# Patient Record
Sex: Female | Born: 1970
Health system: Southern US, Community
[De-identification: ages and names within clinical notes are randomized; demographics above are authoritative.]

---

## 2005-03-30 ENCOUNTER — Emergency Department (HOSPITAL_COMMUNITY): Admission: EM | Admit: 2005-03-30 | Discharge: 2005-03-30 | Payer: Self-pay | Admitting: Emergency Medicine

## 2007-09-04 ENCOUNTER — Other Ambulatory Visit: Admission: RE | Admit: 2007-09-04 | Discharge: 2007-09-04 | Payer: Self-pay | Admitting: Family Medicine

## 2008-02-09 ENCOUNTER — Ambulatory Visit: Payer: Self-pay | Admitting: Sports Medicine

## 2008-02-09 DIAGNOSIS — M79609 Pain in unspecified limb: Secondary | ICD-10-CM

## 2008-02-09 DIAGNOSIS — M25579 Pain in unspecified ankle and joints of unspecified foot: Secondary | ICD-10-CM | POA: Insufficient documentation

## 2008-02-09 HISTORY — DX: Pain in unspecified ankle and joints of unspecified foot: M25.579

## 2008-02-09 HISTORY — DX: Pain in unspecified limb: M79.609

## 2008-02-18 ENCOUNTER — Encounter: Payer: Self-pay | Admitting: Sports Medicine

## 2008-06-24 ENCOUNTER — Ambulatory Visit: Payer: Self-pay | Admitting: Sports Medicine

## 2008-10-18 ENCOUNTER — Ambulatory Visit: Payer: Self-pay | Admitting: Sports Medicine

## 2011-01-23 ENCOUNTER — Ambulatory Visit (INDEPENDENT_AMBULATORY_CARE_PROVIDER_SITE_OTHER): Payer: BC Managed Care – PPO | Admitting: Sports Medicine

## 2011-01-23 ENCOUNTER — Encounter: Payer: Self-pay | Admitting: Sports Medicine

## 2011-01-23 VITALS — BP 96/61 | HR 54 | Ht 62.5 in | Wt 110.0 lb

## 2011-01-23 DIAGNOSIS — M79609 Pain in unspecified limb: Secondary | ICD-10-CM

## 2011-01-23 NOTE — Progress Notes (Signed)
  Subjective:    Patient ID: Natalie Schmidt, female    DOB: 04/27/1971, 40 y.o.   MRN: [id number]  HPI 40 yo female presents for new orthotics. She has been wearing orthotics for 12 years, but lost the pair that were made at this office a few years ago. She would like a new pair.  She is currently wearing an old pair that is many years old, but they are hard and not comfortable. She works out 1.5 hours per day 6 days per week.   Review of Systems     Objective:   Physical Exam NAD  Moderately Cavus foot Broader forefoot Narrow heel No swelling No TTP  Gait is very neutral in orthotics      Assessment & Plan:  Patient was fitted for a standard, cushioned, semi-rigid orthotic.  The orthotic was heated and the patient stood on the orthotic blank positioned on the orthotic stand. The patient was positioned in subtalar neutral position and 10 degrees of ankle dorsiflexion in a weight bearing stance. After molding, a stable Fast-Tech EVA base was applied to the orthotic blank.   The blank was ground to a stable position for weight bearing. Size: 9 base: Blue EVA posting: none additional orthotic padding: none

## 2011-01-23 NOTE — Assessment & Plan Note (Signed)
Cont on custom orthotics  These have been helpful for 12 years  Does not get foot pain w these  Reck prn and modify if needed

## 2014-03-23 ENCOUNTER — Ambulatory Visit: Payer: BC Managed Care – PPO | Admitting: Family Medicine

## 2014-04-05 ENCOUNTER — Ambulatory Visit (INDEPENDENT_AMBULATORY_CARE_PROVIDER_SITE_OTHER): Payer: BC Managed Care – PPO | Admitting: Family Medicine

## 2014-04-05 ENCOUNTER — Encounter: Payer: Self-pay | Admitting: Family Medicine

## 2014-04-05 VITALS — Ht 63.0 in | Wt 108.7 lb

## 2014-04-05 DIAGNOSIS — F5000 Anorexia nervosa, unspecified: Secondary | ICD-10-CM
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
## 2014-04-05 NOTE — Patient Instructions (Addendum)
-   Your body image is a central roadblock to making progress in your well being.    - It's not just about obsessing; you (like most people) cannot see yourself objectively.   - Indicators that your weight and body fat are low now:  - No period since last spring  - Low on BMI and weight charts  - CK levels are high, suggesting muscle wasting  - EKG abnormality  - Poor sleep  - Socially withdrawn  - Eat at least 3 meals and 1 snack per day.  Aim for no more than 5 hours between eating.  Eat breakfast within one hour of getting up.   - A real meal = includes a protein source, starch, and veg's and/or fruit.    - Or:  Would you serve this to a guest in your home, and call it a meal?  - Your meals should look like, taste like, and feel like real meals.   - Record daily intake, including what you eat, how much, and what time you eat.   - Vitamin D:  At least 1000 IU per day.  You get best absorption with a high-fat meal.   - Stop weighing yourself at home.  AT FOLLOW-UP, WE MAY DECIDE TO WEIGH YOU BLINDLY.    - Please look into finding a therapist if you do not get into the study at [place] to Dr. Truett Mainland about possibly increasing your fluoxetine.   - Blood work:  Also ask for vit D level and FERRITIN.   - When you restrict calories for your body, you also restrict food for your gut microbes.   - Check with [facility] to confirm that you have unlimited medical nutrition therapy visits covered.  Let Jeannie know.

## 2014-04-22 ENCOUNTER — Ambulatory Visit: Payer: BC Managed Care – PPO | Admitting: Family Medicine

## 2014-05-11 ENCOUNTER — Ambulatory Visit: Payer: BC Managed Care – PPO | Admitting: Family Medicine

## 2015-03-21 ENCOUNTER — Encounter: Payer: Self-pay | Admitting: Family Medicine

## 2015-03-21 ENCOUNTER — Ambulatory Visit (INDEPENDENT_AMBULATORY_CARE_PROVIDER_SITE_OTHER): Payer: 59 | Admitting: Family Medicine

## 2015-03-21 VITALS — Ht 63.0 in

## 2015-03-21 DIAGNOSIS — F5001 Anorexia nervosa, restricting type: Secondary | ICD-10-CM

## 2015-03-21 NOTE — Patient Instructions (Addendum)
GOAL:  Stop binge-eating and to not eat when not physiologically hungry.   Behavior goals: 1. Eat at least 3 meals and 1-2 snacks per day.  Aim for no more than 5 hours between eating.  Eat breakfast within one hour of getting up.  2. Food choice examples:  Breakfast:  Steel cut oats, 2 eggs  Snack:  Fruit, 3 tbsp nuts  Lunch:  Veg's (unlimited), meat, fish, or beans (protein) with a wrap, fruit  Dinner:   Ditto lunch except your starch can be a cooked grain   Snack:  Yogurt or kefir with berries     Portion control:  Twice the volume of veg's as either pro or starch, using a 10" plate.    Psychiatry referral:  Katheren Shams, MD
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
## 2015-04-05 ENCOUNTER — Ambulatory Visit (INDEPENDENT_AMBULATORY_CARE_PROVIDER_SITE_OTHER): Payer: 59 | Admitting: Family Medicine

## 2015-04-05 ENCOUNTER — Encounter: Payer: Self-pay | Admitting: Family Medicine

## 2015-04-05 VITALS — Ht 63.0 in

## 2015-04-05 DIAGNOSIS — F5001 Anorexia nervosa, restricting type: Secondary | ICD-10-CM | POA: Diagnosis not present

## 2015-04-05 DIAGNOSIS — E039 Hypothyroidism, unspecified: Secondary | ICD-10-CM | POA: Insufficient documentation

## 2015-04-05 DIAGNOSIS — F509 Eating disorder, unspecified: Secondary | ICD-10-CM | POA: Insufficient documentation

## 2015-04-05 NOTE — Patient Instructions (Addendum)
-   Check for therapists at [email or web address].  Some of these therapists do marriage counseling as well.   - Email your recipe for your tea mix and your mug cake.    Important Concepts - Satiety and pleasure from food is crucial to allowing your appetite mechanism to work right.  Your brain is wired to get enjoyment from food.   - Keep in mind that where we see disordered eating, it likely stems from an effort to regulate emotions using food.   - If you feel depressed and irritable now, how much of this is related to not feeding your brain adequately? - Appetite control depends in part on how you start your day, i.e., for best control, you want to eat a good source of protein and some carb (that includes fiber).  Breakfast should include at least 400 kcal.      Goals remain the same: 1. Eat at least 3 meals and 2 snacks per day. Aim for no more than 5 hours between eating. Eat breakfast within one hour of getting up.  2. Food choice examples:   Breakfast: Steel cut oats, 2 eggs   Snack: Fruit, 3 tbsp nuts   Lunch: Veg's (unlimited), meat, fish, or beans (protein) with a wrap, fruit   Dinner: Ditto lunch except your starch can be a cooked grain   Snack: Yogurt or kefir with berries  3. For the next 2 weeks, write down what you eat for the first meal of the day, how much, and what time.  Then also record the TIME at which you first notice hunger following that meal.   Portion control: Twice the volume of veg's as either pro or starch, using a 10" plate.   - Read Eating in the Light of the Moon by Romie Minus.  Use a highlighter as you read!

## 2015-04-05 NOTE — Progress Notes (Signed)
Medical Nutrition Therapy:  Appt start time: 1000 end time:  1100. PCP:   Dr. Maceo Pro [facility]) and Dr. Truett Mainland in [facility] (insur will not cover next yr) Therapist:  None currently; has seen Natalie Schmidt and one other in [place], but was not a good fit; needs to establish with a therapist.  Other medical team members: none; Natalie Schmidt will find out this week if she gets into a [facility] study about eating d/o's, so will hope to establish with a therapist thru the study.    Assessment:  Primary concerns today: anorexia nervosa, restricting type; F50.01. Weight:     123 lb (pt declined wt check today; self-report from 03/30/15; BMI 21.8) Height:     63" Expected body weight:    123 lb (= BMI of 21.8) Percent expected body weight:  100%  Usual eating pattern is erratic; no set schedule, although first meal of day is usually delayed until ~1 PM.   Usual physical activity includes 35-60 min running/day, sometimes weight training (1-2 X wk).    Natalie Schmidt said her anxiety has improved in the past year since she has gained weight, and although she still has anxiety, she can manage it.  Natalie Schmidt said she is tracking kcal intake, and estimates her intake yesterday was 2900.  Her own ideal would no more than 10% body fat, but said she could probably live with 13-15% fat.  Highest non-pregnant weight was 130 lb as a teenager, which is a time when she was binge-eating.  Natalie Schmidt said she is bingeing now, even though she is eating every 2 1/2-3 hrs.  She feels her hunger mechanism is "not working."  She said she feels hot a lot of the time now, and food doesn't taste good.  Again said she wishes she could just not eat, and she is especially bothered by her frequent feelings of hunger and inclination to eat sweets or other "non-foods."    Discussed at length Natalie Schmidt's resistance to eating satisfying foods, i.e., choosing instead mixtures of fiber sources with cottage cheese, etc. (see 24-hr recall below), and our human need for satisfying food  in order for our appetite mechanism to work correctly.  When asked when she has last eaten "normally," Natalie Schmidt said she ate pretty normal meals up until about 12 yrs ago, and she maintained a weight of ~108 at that time - without thinking about it.  She insists, however, that her body is constitutionally dense, that she cannot be thin by eating "normally."  24-hr recall:  (Up at 7 AM) B (8 AM)-  10 pistachios, 2 jaw brkrs, 1 cough drop, handful grapes, 5 choc squares Snk ( AM)-  --- L (11-1 PM)-  5 oz carrots, 2 small heads romaine, 8 oz [place], tea mix: lemon jc,  2 T raspber puree, 2 T cocoa, 2 T pro pdr, 1 T oat fiber, 2 T alm flour, 2 egg whts, 2-3 swtnr, 2 T cot chs, 1/2 t psyllium (270 kcal) Snk (2:30)-  4 oz [place], 3 oz choc cake (no frstng), 3 sml bananas, 1 1/2 T pb, gum (1000 kcal)  D (8:30 PM)-  Tea mix: pro & cocoa pdr, oat fiber, soaked oat groats, psyllium, 1 swtnr, 10 grapes, 9 g chs Snk (12 AM)-  1/4 lrg apple, 1/2 c raw chard, 1/4 oz cheese  Typical day? Yes.    RD Natalie Schmidt had advised Natalie Schmidt to eat at least 1800 kcal/day (w/ no exercise), with additional 250 kcal per 45 min  of exercise.  Esti estimates she is eating ~2400 kcal/day.     Progress Towards Goal(s):  In progress.   Nutritional Diagnosis:  NB-1.2 Harmful beliefs/attitudes about food or nutrition-related topics (use with caution) As related to eating disorder.  As evidenced by expressed extreme anxiety over weight despite a low BMI (19).    Intervention:  Nutrition counseling.   Learning Readiness:  Patient actually seems more ambivalent today (moving toward readiness?); has been maintaining a reasonable kcal intake, although still expresses a lot of anxiety about weight.   Handouts given during visit include:  AVS  Demonstrated degree of understanding via:  Teach Back   Monitoring/Evaluation:  Dietary intake, exercise, and body weight in 3 week(s).

## 2015-04-13 ENCOUNTER — Ambulatory Visit: Payer: 59 | Admitting: Family Medicine

## 2015-04-18 ENCOUNTER — Ambulatory Visit: Payer: 59 | Admitting: Family Medicine

## 2015-04-25 ENCOUNTER — Ambulatory Visit (INDEPENDENT_AMBULATORY_CARE_PROVIDER_SITE_OTHER): Payer: 59 | Admitting: Family Medicine

## 2015-04-25 ENCOUNTER — Encounter: Payer: Self-pay | Admitting: Family Medicine

## 2015-04-25 VITALS — Ht 63.0 in | Wt 124.0 lb

## 2015-04-25 DIAGNOSIS — F5001 Anorexia nervosa, restricting type: Secondary | ICD-10-CM

## 2015-04-25 NOTE — Progress Notes (Signed)
Medical Nutrition Therapy:  Appt start time: 1000 end time:  1100. PCP:   Dr. Maceo Pro [facility]) and Dr. Truett Mainland in [facility] (insur will not cover next yr) Therapist:  None currently; has seen Bennie Pierini and one other in [place], but was not a good fit; needs to establish with a therapist.  Other medical team members: none; [place] will find out this week if she gets into a [facility] study about eating d/o's, so will hope to establish with a therapist thru the study.    Assessment:  Primary concerns today: anorexia nervosa, restricting type; F50.01. Weight:     124 lb (self-reported from last week; BMI 21.9) Height:     63" Expected body weight:    123 lb (= BMI of 21.8) Percent expected body weight:  100%  Usual eating pattern is erratic; no set schedule, although first meal of day is usually delayed until ~1 PM.   Usual physical activity includes 35-60 min running/day, sometimes weight training (1-2 X wk).    Brae refused a weight check again today.  She did get the book Eating in the Light of the [place], which she has found to be very enlightening, but also very emotional.  She became tearful as she talked about some of the content helping her to realize much of the unfinished therapy work she has to do.  Ambra said her therapy at [facility] was helpful, but really just addressed "the surface."   She said she has been very depressed, including SI recently.  She again assured me that although she has considered plans, she will not act on them.  Dreya would not sign a contract that she won't hurt herself, but she did promise to be here for her next scheduled appt on January 10.  Significantly, she did agree to call for a therapy appt.    All 5 of Brinlynn's children have mental health problems, which is part of what fuels her depression.  Her 18-YO daughter is home now, and is dealing with her own severe depression as well.   Delorus has been trying to eat breakfast within the first hour of getting up, i.e., sauteed curried  veg's with olive oil or nut butter and chicken and quinoa or chick peas, aiming for ~500 kcal.  She said doing this would make her hungry ~90 min later, and again another 90 min later if she ate again.  We talked about the importance of eating foods that are truly appealing, not just nutritionally dense.    Ranay had been out of town for 10 days, got home Friday, and food choices have been chaotic since then.  Spent the whole wkend crying.  Abigayil could not remember yesterday's intake.  Today she had tea mix (below), couple bites of salad, and at 2 PM 6 tortilla chips & 3 oz [place], followed by gum and water.    Tyrese's standard "tea" drink: Tea (herb, green, or black) 2-3 T cocoa powder 1 T protein powder  T oat fiber  t psyllium 1-2 packs art swtnr (Truvia/Splenda) Sometimes adds vanilla and/or cinnamon and 1 T oat bran or 1 t soaked chia seeds.  RD Rondell Reams had advised Deneise Lever to eat at least 1800 kcal/day (w/ no exercise), with additional 250 kcal per 45 min of exercise.      Progress Towards Goal(s):  In progress.   Nutritional Diagnosis:  NB-1.2 Harmful beliefs/attitudes about food or nutrition-related topics (use with caution) As related to eating disorder.  As evidenced by expressed extreme anxiety over weight despite a low BMI (19).    Intervention:  Nutrition counseling.   Learning Readiness:  Patient actually seems more ambivalent today (moving toward readiness?); has been maintaining a reasonable kcal intake, although still expresses a lot of anxiety about weight.   Handouts given during visit include:  AVS  Demonstrated degree of understanding via:  Teach Back   Monitoring/Evaluation:  Dietary intake, exercise, and body weight in 3 week(s).

## 2015-04-25 NOTE — Patient Instructions (Addendum)
-   Send Natalie Schmidt the ingredients for your mug cake.    Goals for the next few weeks: 1. Eat mindfully, but making sure you are truly enjoying what you're eating.    - Give yourself a check each meal that meets this criterium. 2. Eat at least 3 REAL meals and 1-2 snacks per day.  Aim for no more than 5 hours between eating.  Eat breakfast within one hour of getting up.      Write about your response to this eating pattern.   3. Call for an appt with a therapist of your choice: [email or web address] 4. Continue to read Eating in the Light of the [facility].

## 2015-05-17 ENCOUNTER — Ambulatory Visit (INDEPENDENT_AMBULATORY_CARE_PROVIDER_SITE_OTHER): Payer: 59 | Admitting: Family Medicine

## 2015-05-17 ENCOUNTER — Encounter: Payer: Self-pay | Admitting: Family Medicine

## 2015-05-17 VITALS — Ht 63.0 in | Wt 128.0 lb

## 2015-05-17 DIAGNOSIS — F5001 Anorexia nervosa, restricting type: Secondary | ICD-10-CM | POA: Diagnosis not present

## 2015-05-17 NOTE — Progress Notes (Signed)
Medical Nutrition Therapy:  Appt start time: 1000 end time:  1100. PCP:   Dr. Maceo Pro [facility]) and Dr. Truett Mainland in [facility] (insur will not cover next yr) Therapist:  None currently; has seen Bennie Pierini and one other in [place], but was not a good fit; needs to establish with a therapist.  Other medical team members: none; Haizley will find out this week if she gets into a [facility] study about eating d/o's, so will hope to establish with a therapist thru the study.    Assessment:  Primary concerns today: anorexia nervosa, restricting type; F50.01. Weight:     128 lb (self-reported from today; BMI 21.9) Height:     63" Expected body weight:    123 lb (= BMI of 22.7) Percent expected body weight:  100%  Usual eating pattern is erratic; no set schedule, although first meal of day is usually delayed until ~1 PM.   Usual physical activity includes 35-60 min running/day, sometimes weight training (1-2 X wk).    Panzy refused a weight check again today, although she weighed herself at home before coming (using a medical scale).  She has stopped taking her fluoxetine; said she feels even more suicidal when taking it.  She said that although the past couple of weeks have been very bad in terms of depression, she feels the last 3-4 days have improved some.  On Sunday, she had an especially good day, the best in almost 3 years.    Amoya is very distressed that her weight has continued to go up.  She still is feeling hunger ~90 min after eating.  She usually responds by drinking only water, which buys her another 2 hours before she gives in to her hunger and eat again.  At other times, she feels just as hungry after eating as before.  Natalin tracked her meals to see how many times she truly enjoyed her food:  She enjoyed the food only 12 times during those two weeks.  We talked at length about the contributors to hunger, and identified a number of factors that may be at play for [facility].    Naliya has called four therapists,  and has not heard back from even one so far.  She called two psychiatrists, both of whom are out of network for her insurance.  She has continued reading eating in the Light of the [place], and is quite surprised by how relevant she is finding it.  Would like an opportunity to discuss some of it, although she did not remember to bring her notes on it today.    24-hr recall:  (Up at 7:30 AM) B (9:30 AM)-  20 almonds, GF crepes (6 egg whites, 2 T rice flr, ginger, 1/4 c quinoa, xantham gum), 6 oz pureed fruit (1 sugar:5 fruit);  Snk (10:30)-  2 small sq's of cake  L (12 PM)-  1 1/2 banana, 1 c 2% milk, 2 T alm meal (60 kcal), 3-4 bites cake Snk (4 PM)-  Few bites of choc's D (5 PM)-  1 c salad (collards, apple, 1 oz bl chs, 1/2 oz walnuts, 3 oz tofu, vinegar; 1/2 cocoa mix (50 kcal) Snk (9 PM)-  1 c 2% milk Typical day? Yes.  for a non-binge day.  Has been bingeing most days until last week, when bingeing has decreased.  RD Rondell Reams had advised Deneise Lever to eat at least 1800 kcal/day (w/ no exercise), with additional 250 kcal per 45 min of exercise.  Progress Towards Goal(s):  In progress.   Nutritional Diagnosis:  NB-1.2 Harmful beliefs/attitudes about food or nutrition-related topics (use with caution) As related to eating disorder.  As evidenced by expressed extreme anxiety over weight despite a low BMI (19).    Intervention:  Nutrition counseling.  Handouts given during visit include:  AVS  Demonstrated degree of understanding via:  Teach Back   Monitoring/Evaluation:  Dietary intake, exercise, and body weight in 2 week(s).

## 2015-05-17 NOTE — Patient Instructions (Addendum)
-   Psychiatrist Katheren Shams, MD in [facility] - Appetite Mechanism:  - Psychological component, i.e., over-focus on food and eating; the brain will drive you to eat for pleasure, so if you don't get pleasure from food, it will be hard to ever feel satiated; we     can obsess about foods when we feel we're getting the "wrong" ones or not enough "right" ones.  - Physical component, diet composition and timing of eating. Moderate exercise also usually helps the appetite mechanism to work better.    - Cultural adaptation, i.e., your body adapts to an imposed eating schedule, then wants to maintain that schedule.  Remember how long it's been since you had a "normal" eating    schedule and diet composition!  - If you are ravenous by the time you eat breakfast, you have waited too long.  It will be harder to reach satiety.  Breakfast within the first hour of being up! - There needs to be three definitive meals each day.  "Meals" should look like, feel like, and taste like a real meal, and not a snack.    - Would you serve this to a guest in your home, and call it a meal? - Moderate exercise:  You might want to start with 20 min of moderate strength building exercises at home using body weight only, i.e., squats, lunges, situps, pushups, other?  WRite out your routine, and limit yourself to 20 min max.    Goals remain the same: 1. Eat mindfully, but making sure you are truly enjoying what you're eating.  - Give yourself a check each meal that meets this criterium. 2. Eat at least 3 REAL meals and 1-2 snacks per day. Aim for no more than 5 hours between eating. Eat breakfast within one hour of getting up.   Write about your response to this eating pattern.  3. Call for an appt with a therapist of your choice: [email or web address] 4. Continue to read Eating in the Light of the [facility].  - Furniture conservator/restorer (1) which therapists you called and (2) sections of Eating in the  Light of the [place] that resonate with you especially.    - Follow- appts are Thur, 1/26 at 1:30 and Mon, 2/6 at 11 AM.

## 2015-06-02 ENCOUNTER — Ambulatory Visit (INDEPENDENT_AMBULATORY_CARE_PROVIDER_SITE_OTHER): Payer: 59 | Admitting: Family Medicine

## 2015-06-02 ENCOUNTER — Encounter: Payer: Self-pay | Admitting: Family Medicine

## 2015-06-02 VITALS — Ht 63.0 in

## 2015-06-02 DIAGNOSIS — F5001 Anorexia nervosa, restricting type: Secondary | ICD-10-CM | POA: Diagnosis not present

## 2015-06-02 NOTE — Progress Notes (Signed)
Medical Nutrition Therapy:  Appt start time: 1000 end time:  1100. PCP:   Dr. Maceo Pro [facility]) and Dr. Truett Mainland in [facility] (insur not covering now) Therapist:  None currently; has seen [employer] and one other in [place], but was not a good fit; needs to establish with a therapist.  Other medical team members: none; Natalie Schmidt will find out this week if she gets into a [facility] study about eating d/o's, so will hope to establish with a therapist thru the study.    Assessment:  Primary concerns today: anorexia nervosa, restricting type; F50.01. Weight:     Not measured Height:     63" Expected body weight:    123 lb (= BMI of 22.7) Percent expected body weight:  ---  Natalie Schmidt refused a weight check again today.  Depression is still severe.  Natalie Schmidt called psychiatrist Katheren Shams, but found she is out of network for her insurance.  She found the same results with a couple others she called.  Natalie Schmidt suspects she is bipolar, but has never been evaluated for this.  She did not call any therapists, having given up after trying to find a psychiatrist.    Had a long discussion today about residential treatment, which Natalie Schmidt agreed to at least investigate.  She said she trusted my judgment, and seemed receptive to the possibility that she can feel better at some point, and that life does not have to feel so hard as it does right now.  Also talked of some things Natalie Schmidt has been passionate about in the past, i.e., she loves to learn (and is very bright, I believe), and loves certain types of art/architecture.  Emphasized that these passions are still in her, but need to be "unearthed."  Natalie Schmidt agreed to schedule follow-up appts, which she said serve to help keep her on track.     Progress Towards Goal(s):  In progress.   Nutritional Diagnosis:  NB-1.2 Harmful beliefs/attitudes about food or nutrition-related topics (use with caution) As related to eating disorder.  As evidenced by expressed extreme anxiety over weight despite a low  BMI (19).    Intervention:  Nutrition counseling.  Handouts given during visit include:  AVS  Demonstrated degree of understanding via:  Teach Back   Monitoring/Evaluation:  Dietary intake, exercise, and body weight in 2 week(s).

## 2015-06-02 NOTE — Patient Instructions (Signed)
-   Write two lists:  - The potential benefits of residential treatment  - Potential downside of residential treatment  - Another two lists:  - The potential benefits of making no changes  - Potential downside of making no changes   - Email Jeannie your lists no later than tomorrow.    - I recommend that you at least explore residential treatment:  - [place]  - Call at least one to inquire about what it takes to participate in their program, i.e., paperwork, referrals, labwork, insurance?  - Email Jeannie by Friday to let me know what they say, and what the next step is.

## 2015-06-13 ENCOUNTER — Ambulatory Visit (INDEPENDENT_AMBULATORY_CARE_PROVIDER_SITE_OTHER): Payer: 59 | Admitting: Family Medicine

## 2015-06-13 ENCOUNTER — Encounter: Payer: Self-pay | Admitting: Family Medicine

## 2015-06-13 VITALS — Ht 63.0 in

## 2015-06-13 DIAGNOSIS — F5001 Anorexia nervosa, restricting type: Secondary | ICD-10-CM | POA: Diagnosis not present

## 2015-06-13 NOTE — Progress Notes (Signed)
Medical Nutrition Therapy:  Appt start time: 1000 end time:  1100. PCP:   Dr. Maceo Pro [facility]) and Dr. Truett Mainland in [facility] (insur not covering now) Therapist:  None currently; has seen [employer] and one other in [place], but was not a good fit; needs to establish with a therapist.  Other medical team members: none; Rainey will find out this week if she gets into a [facility] study about eating d/o's, so will hope to establish with a therapist thru the study.    Assessment:  Primary concerns today: anorexia nervosa, restricting type; F50.01. Weight:     Not measured Height:     63" Expected body weight:    123 lb (= BMI of 22.7) Percent expected body weight:  ---  Natalie Schmidt refused a weight check again today.  She saw a psychiatrist last week, who prescribed two new med's (clonazepam and trintellix), but she has not started them yet; is ambivalent about doing so.    Cardell has at least initiated the process of assessment for admission with [facility], and brought the hard copy of her forms for me to fax to them today.  She is still quite reluctant, believing there is no place likely to help her ever be able to accept living in her "fat" body (which is currently likely to be well within normal range of BMI).  We had a long discussion about residential treatment, and she is also willing to explore [place].     Progress Towards Goal(s):  In progress.   Nutritional Diagnosis:  NB-1.2 Harmful beliefs/attitudes about food or nutrition-related topics (use with caution) As related to eating disorder.  As evidenced by expressed extreme anxiety over weight despite a low BMI (19).    Intervention:  Nutrition counseling.  Handouts given during visit include:  AVS  Demonstrated degree of understanding via:  Teach Back   Monitoring/Evaluation:  Dietary intake, exercise, and body weight in 2 week(s).

## 2015-06-13 NOTE — Patient Instructions (Signed)
-   Look into [place], and do their online submission if you feel this is a viable option.    - Email me with an update as to your choices and action.  - I will fax your [place] Free form to them today.   - Embrace documentary tonight!  Food goal: Eat at least 4 X day, including 3 REAL meals.

## 2015-07-07 ENCOUNTER — Ambulatory Visit (INDEPENDENT_AMBULATORY_CARE_PROVIDER_SITE_OTHER): Payer: 59 | Admitting: Family Medicine

## 2015-07-07 ENCOUNTER — Encounter: Payer: Self-pay | Admitting: Family Medicine

## 2015-07-07 VITALS — Ht 63.0 in | Wt 127.0 lb

## 2015-07-07 DIAGNOSIS — F5001 Anorexia nervosa, restricting type: Secondary | ICD-10-CM | POA: Diagnosis not present

## 2015-07-07 NOTE — Progress Notes (Signed)
Medical Nutrition Therapy:  Appt start time: 1000 end time:  1100. PCP:   Dr. Maceo Pro [facility]) and Dr. Truett Mainland in [facility] (insur not covering now) Therapist:  None currently; has seen [employer] and one other in [place], but was not a good fit; needs to establish with a therapist.  Other medical team members: none; Maudella will find out this week if she gets into a [facility] study about eating d/o's, so will hope to establish with a therapist thru the study.    Assessment:  Primary concerns today: anorexia nervosa, restricting type; F50.01. Weight:     127 lb (self-rept) Height:     63" Expected body weight:    123 lb (= BMI of 22.7) Percent expected body weight:  ---  Deneise Lever refused a weight check again today, but reported a recent weight measured at home.  She recently returned from a 2-wk trip to [place], which she took by herself.  She started on the new Rx Trintellix about a week before she left, to which she attributes her suicidality going away.   Maryia has realized that she is able to not obsess over food now that she is weight-restored, but that now she is dealing with the obsession of living with fat on her body.  Although she has been physically active for most of her life, she does not want to exercise at all now b/c of how the fat feels on her.  I spent a good amount of time today explaining to Iler why we need to meet the body's energy needs, and that we need a certain amount of body fat to function optimally.  Also talked about how both physiology and our mental perspective can change over time, changing how we "feel" in our bodies and changing the level of acceptance/appreciation we have of them.     Progress Towards Goal(s):  In progress.   Nutritional Diagnosis:  NB-1.2 Harmful beliefs/attitudes about food or nutrition-related topics (use with caution) As related to eating disorder.  As evidenced by expressed extreme anxiety over weight despite a normal BMI (22.5).    Intervention:  Nutrition  counseling.  Handouts given during visit include:  AVS  [phone] handout  Demonstrated degree of understanding via:  Teach Back   Monitoring/Evaluation:  Dietary intake, exercise, and body weight in 2 week(s).

## 2015-07-07 NOTE — Patient Instructions (Addendum)
-   I like your concept of compromise with respect to exercise and your weight.   - Getting to the best weight for YOU requires meeting your energy needs with food, and exercising to the point where you can build and maintain an optimal muscle mass.  Keep in mind that in the past 18 months you have lost a LOT of muscle mass and strength.  This means you have also affected your metabolic rate.  As you are able to continue to meet your energy needs, your body now can start to rebuild muscle.  If you can be patient in this process (trust your body), you will likely feel (and be) stronger, and your body composition will change to meet its new demands, i.e., exercise.    - A starved body is usually very low in fat, but it's also guaranteed to be low in muscle.  A body with low muscle mass eventually becomes less and less independent.    - The KEY to getting to where you want to go is being patient enough to allow the body its time to adapt to both the demands of exercise and its food intake.   - Another part of being successful in this process is working on your perception of what is acceptable - both in terms of ultimate body size/shape/conposition and in how long the process takes.  THIS is a perfect time to use the [phone] process: Use the handout provided today, and watch the 30-minute Explanation: https://www.basementfamous.com  Email [place] if you have thoughts/Qs.    WHEN YOU COME IN NEXT TIME, WE WILL DEFINITELY DO A 24-HR RECALL OF WHAT YOU ATE.

## 2015-07-21 ENCOUNTER — Ambulatory Visit (INDEPENDENT_AMBULATORY_CARE_PROVIDER_SITE_OTHER): Payer: 59 | Admitting: Family Medicine

## 2015-07-21 ENCOUNTER — Encounter: Payer: Self-pay | Admitting: Family Medicine

## 2015-07-21 VITALS — Ht 65.0 in

## 2015-07-21 DIAGNOSIS — F5001 Anorexia nervosa, restricting type: Secondary | ICD-10-CM | POA: Diagnosis not present

## 2015-07-21 NOTE — Patient Instructions (Addendum)
-   Joint pain supplements: Glucosamine-chondroitin; turmeric; fish oil? - Gratitude:  Take a moment, and think about what about your body you are grateful for.    - Spending time:  Give some thought to what you would do if you never had to spend time thinking about or stressing over food and weight.   - How would you be different?  What would you do?  What would accomplish?  - After finishing a meal, if you still feel hungry, determine a number of minutes you will wait to see if this hunger passes.    - If still hungry, get a snack, and repeat the process.  In determining what to snack on, remember the Qs of a good food decision:  1. What am I in the mood for?  2. How hungry am I?  3. What's good for me?    Ask ALL 3 for the best decision.

## 2015-07-21 NOTE — Progress Notes (Signed)
Medical Nutrition Therapy:  Appt start time: 1000 end time:  1100. PCP:   Dr. Maceo Pro [facility]) and Dr. Truett Mainland in [facility] (insur not covering now) Therapist:  None currently; has seen [employer] and one other in [place], but was not a good fit; needs to establish with a therapist.  Other medical team members: none; Breanne will find out this week if she gets into a [facility] study about eating d/o's, so will hope to establish with a therapist thru the study.    Assessment:  Primary concerns today: anorexia nervosa, restricting type; F50.01. Weight:     --- Height:     63" Expected body weight:    123 lb (= BMI of 22.7) Percent expected body weight:  ---  Deneise Lever refused a weight check again today.  She has not been weighing herself, but is convinced that her weight is continuing to go up.  Annalysse said she is not restricting at all, and feels frustrated that she often feels out of control with her eating now.  She still struggles with exercising because of how much she feels her added body weight when she exercises.    We discussed at length the Qs Deneise Lever had emailed prior to today's appt:  Why exercise needs to be a part of her self-care in addn to making good food choices (she had said it is less effective than kcal restrxn to manage weight, so why bother?); and Qs about joint pain, anti-inflammatories, and how to better control her appetite.  She also asked about therapists; she has now seen Joanna Puff twice, and was rather noncommittal about decision to continue.  Kaybree again insisted that her body's natural inclination is to eat a lot and to be heavy.  When I asked her when she had ever consistently eaten in a way that actually met her energy needs, she admitted that she has never actually done so for any period of time, so admitted perhaps she doesn't know how she'll respond to this.    Significantly, Tacha has used the Urge process a few times, and has found it very helpful.  Even taught it to her daughter,  who suffers from depression.    24-hr recall:  (Up at 6:30 AM) B (8 AM)-  1 salad, 1 1/2 chx brst, mandarin oranges, 1-2 oz l-f chs, vinegar, 1/2 c bbq Snk ( AM)-  --- L (12 PM)-  Smoothie: 10 oz plain kefir, 1/2 c strber puree, cocoa mix w/ protein powder (~120 kcal), 1/4 c strber puree/jam & french cheese (120 kcal)  Snk (4 PM)-  [3 oz whole milk, pro powder (55 kcal)] X 2; 6 almonds D (6:30 PM)-  1 salad 4 oz chx, 2 T soaked lentils, 1/4 c strber puree/jam & french cheese (120 kcal), cocoa mix w/ protein powder (~120 kcal), 1/2 c beef&pork in sauce Snk (10 PM)-  6 almonds, 3 oz whole milk (65 kcal), pro powder (55 kcal)  Typical day? Yes.     Progress Towards Goal(s):  In progress.   Nutritional Diagnosis:  NB-1.2 Harmful beliefs/attitudes about food or nutrition-related topics (use with caution) As related to eating disorder.  As evidenced by expressed extreme anxiety over weight despite a normal BMI (22.5).    Intervention:  Nutrition counseling.  Handouts given during visit include:  AVS  Demonstrated degree of understanding via:  Teach Back   Monitoring/Evaluation:  Dietary intake, exercise, and body weight in 2 week(s).

## 2015-08-16 ENCOUNTER — Ambulatory Visit (INDEPENDENT_AMBULATORY_CARE_PROVIDER_SITE_OTHER): Payer: 59 | Admitting: Family Medicine

## 2015-08-16 ENCOUNTER — Encounter: Payer: Self-pay | Admitting: Family Medicine

## 2015-08-16 VITALS — Ht 63.0 in | Wt 133.0 lb

## 2015-08-16 DIAGNOSIS — F5001 Anorexia nervosa, restricting type: Secondary | ICD-10-CM | POA: Diagnosis not present

## 2015-08-16 NOTE — Patient Instructions (Addendum)
-   Check out the website:  NonSoap.gl  - Add at least one serving (1/2 cup) of a carb food to each meal.  - Give some thought to how else you can "normalize" your eating.    - Use the Urge 911 process, and write your answers when possible.    - Continue to eat at least 3 meals and 1-2 snacks per day.

## 2015-08-16 NOTE — Progress Notes (Signed)
Medical Nutrition Therapy:  Appt start time: 1000 end time:  1100. PCP:   Dr. Maceo Pro [facility]) and Dr. Truett Mainland in [facility] (insur not covering now) Therapist:  Joanna Schmidt   Assessment:  Primary concerns today: anorexia nervosa, restricting type; F50.01. Weight:     133 Height:     63" Expected body weight:    123 lb (= BMI of 22.7) Percent expected body weight:  ---  Natalie Schmidt refused a weight check again today, but reported a weight from last check at home.  She is still uncomfortable with her current weight, and said it has continued to go up.  She has been pretty successful in eating normal amounts of food and at normal times.  She is doing weight training 4-5 X wk and some cardio exercise no more than once a week.    Natalie Schmidt did start to use the Urge911 process, but lost her sheet, so did not remember all steps.    Natalie Schmidt has continued to see therapist Natalie Schmidt, and said that is going well; plans to see her every other week.    Today's recall (today and last night):  (Up at 7 AM) B (8:30 AM)-  1/4 apple, muffin (120 kcal), sports drink (lemon juice, spts drink powder) (20 kcal)  Wt training Snk (11:30)-  1 c of: pro powder, colostrum, pumpkin puree, apple sauce (200+ kcal) L (1 PM)-  6 c salad greens, 6 oz chx, 1 oz chs, kambucha, vinegar, 1-2 tsp psyllium powder, tea, 1 cocoa ball (100 kcal) Snk ( PM)-   Last night: D (6:30 PM)-  ??? Snk (10 PM)-  ??? Typical day? Yes.  except on 1-2 days a week Natalie Schmidt is ravenous first thing in the day.     Progress Towards Goal(s):  In progress.   Nutritional Diagnosis:  Slilght progress on NB-1.2 Harmful beliefs/attitudes about food or nutrition-related topics (use with caution) As related to eating disorder.  As evidenced by expressed extreme anxiety over weight despite a normal BMI (22.5).    Intervention:  Nutrition counseling.  Handouts given during visit include:  AVS  Demonstrated degree of understanding via:  Teach Back    Monitoring/Evaluation:  Dietary intake, exercise, and body weight in 2 week(s).

## 2015-09-01 ENCOUNTER — Encounter: Payer: Self-pay | Admitting: Family Medicine

## 2015-09-01 ENCOUNTER — Ambulatory Visit (INDEPENDENT_AMBULATORY_CARE_PROVIDER_SITE_OTHER): Payer: 59 | Admitting: Family Medicine

## 2015-09-01 VITALS — Ht 63.0 in | Wt 133.0 lb

## 2015-09-01 DIAGNOSIS — F5001 Anorexia nervosa, restricting type: Secondary | ICD-10-CM | POA: Diagnosis not present

## 2015-09-01 NOTE — Progress Notes (Signed)
Medical Nutrition Therapy:  Appt start time: 1000 end time:  1100. PCP:   Dr. Maceo Pro [facility]) and Dr. Truett Mainland in [facility] (insur not covering now) Therapist:  Joanna Puff  Assessment:  Primary concerns today: anorexia nervosa, restricting type; F50.01. Weight:     133 (self-report) Height:     63" Expected body weight:    123 lb (= BMI of 22.7) Percent expected body weight:  ---  Deneise Lever refused a weight check again today, but said it is unchanged, according to her home scale.  Tatyanna has used the [phone] process, but said today she really has not done the 4th step of intentional focus shifting.  She finds this challenging b/c she does not know what she finds rewarding.  Before her eating d/o, Yordanos enjoyed being in nature, hiking, biking, tennis, and other outdoor activities, cello, and gardening.    She is also frustrated as she has tried to normalize her eating.  Ilan still does not enjoy eating, and she doesn't like feeling satiated.  Anjolie also feels shame when she is seen eating, citing an example today of eating her lunch as she waited to pick her daughter up from school.  A man approached her to talk, and she felt tremendous shame for eating, feeling she did not deserve to eat.  We processed this experience using the Urge911 steps, and Nasim realized she has not been doing it exactly as intended.  She seemed to understand better both how it should be practiced and how it can help.  She became tearful as she talked of not wanting to feel these feelings, and that she believes intellectually that she does deserve to eat.    Ellieana is still seeing therapist Joanna Puff every other week, which has been helpful.    No recall today.     Progress Towards Goal(s):  In progress.   Nutritional Diagnosis:  Slight progress on NB-1.2 Harmful beliefs/attitudes about food or nutrition-related topics (use with caution) As related to eating disorder.  As evidenced by patient's admission that she does  actually deserve to eat (independent of what her weight is).    Intervention:  Nutrition counseling.  Handouts given during visit include:  AVS  Demonstrated degree of understanding via:  Teach Back   Monitoring/Evaluation:  Dietary intake, exercise, and body weight in 2 week(s).

## 2015-09-01 NOTE — Patient Instructions (Addendum)
-   Continue to use the Urge process, using the handout exactly as written.    - Spend some time thinking about what feeds your soul (step 4) or what you remember did at one time.  Write those down.  Bring to follow-up.   - Suggestion:  When no one is home, pull out your cello.     - Continue to get 3 real meals a day and snacks as appropriate (never go more than 5 hrs without eating).

## 2015-09-26 ENCOUNTER — Ambulatory Visit: Payer: 59 | Admitting: Family Medicine

## 2015-09-29 ENCOUNTER — Ambulatory Visit: Payer: 59 | Admitting: Family Medicine

## 2015-10-17 ENCOUNTER — Ambulatory Visit (INDEPENDENT_AMBULATORY_CARE_PROVIDER_SITE_OTHER): Payer: 59 | Admitting: Family Medicine

## 2015-10-17 ENCOUNTER — Encounter: Payer: Self-pay | Admitting: Family Medicine

## 2015-10-17 VITALS — Ht 63.0 in | Wt 130.0 lb

## 2015-10-17 DIAGNOSIS — F5001 Anorexia nervosa, restricting type: Secondary | ICD-10-CM | POA: Diagnosis not present

## 2015-10-17 NOTE — Progress Notes (Signed)
Medical Nutrition Therapy:  Appt start time: 1000 end time:  1100. PCP:   Dr. Maceo Pro [facility] FM) and Dr. Truett Mainland in [facility] (insur not covering now) Therapist:  Joanna Puff  Assessment:  Primary concerns today: anorexia nervosa, restricting type; F50.01. Weight:     130 (self-report) Height:     63" Expected body weight:    123 lb (= BMI of 22.7)  Natalie Schmidt refused a weight check again today.  Self-report is 130 lb.  Natalie Schmidt struggled with the recommendation to make a list of activities that give her pleasure/fulfillment.  She said she doesn't think she can enjoy anything at a body weight of 130 lb. She said she has recently realized how long she has been obsessed with her weight (~age 45).  We discussed her frequent self-description as fat, and how this language, especially repeated frequently, reinforces her belief that she is fat, although no one else sees her this way.     Natalie Schmidt is eating 3 meals a day and snacks.  Significantly, She said she is now experiencing satiety after eating, which she interprets as her body adapting to "normal eating."  She said she hates the feeling of satiation, but seemed to have forgotten that she liked even less the feeling of hunger 1-2 hrs post-eating that she complained of a couple months ago.  Natalie Schmidt seemed to appreciate this apparent progress when realized today.    Again, no 24-hr recall today.   Progress Towards Goal(s):  In progress.   Nutritional Diagnosis:  Stable progress on NB-1.2 Harmful beliefs/attitudes about food or nutrition-related topics (use with caution) As related to eating disorder.  As evidenced by usual intake of 3 meals and a couple of snacks daily.    Intervention:  Nutrition counseling.  Handouts given during visit include:  AVS  Demonstrated degree of understanding via:  Teach Back   Monitoring/Evaluation:  Dietary intake, exercise, and body weight in 1 week(s).

## 2015-10-17 NOTE — Patient Instructions (Signed)
-   Language matters:  Commit to stop using fat language about yourself.    - Be objective.    - Catch yourself in other negative talk.    - Write down at the end of the day some thoughts about this process.  - Continue to add to your list of Step 4 activities.    - Look for [employer] Series I and II.   - Use the Urge911 process at least 4 times a week (daily is better).

## 2015-10-24 ENCOUNTER — Ambulatory Visit: Payer: 59 | Admitting: Family Medicine

## 2015-11-07 ENCOUNTER — Ambulatory Visit (INDEPENDENT_AMBULATORY_CARE_PROVIDER_SITE_OTHER): Payer: 59 | Admitting: Family Medicine

## 2015-11-07 ENCOUNTER — Encounter: Payer: Self-pay | Admitting: Family Medicine

## 2015-11-07 VITALS — Ht 63.0 in

## 2015-11-07 DIAGNOSIS — F5001 Anorexia nervosa, restricting type: Secondary | ICD-10-CM

## 2015-11-07 NOTE — Patient Instructions (Addendum)
-   Use the [phone] process repeatedly each day.  Commit to using the process exactly as it is written on your handout, and write your answers occasionally as you are able.    - Bring your writing to follow-up.    - Eat at least 3 real meals a day:  Include some protein, starch, and veg's at both lunch and dinner.    - Also be sure you get an adequate breakfast:  Include protein, starch, veg's/fruit and some high-quality fat.    - Share the Urge process with [facility].

## 2015-11-07 NOTE — Progress Notes (Signed)
Medical Nutrition Therapy:  Appt start time: 1100 end time:  1200. PCP:   Dr. Maceo Pro [facility]) and Dr. Truett Mainland in [facility] (insur not covering now) Therapist:  Joanna Puff  Assessment:  Primary concerns today: anorexia nervosa, restricting type; F50.01. Weight:     130 (self-report) Height:     63" Expected body weight:    123 lb (= BMI of 22.7)  Atalaya said she has thought about her use of "fat language," but has found this challenging, and "it doesn't change how I feel about myself."  Amiliah feels she has been regressing, making poorer food choices, and she constantly hears that ED voice that is so critical.  We did not do a 24-hr recall today b/c time was spent talking about Azriel's inability to see herself as anything but fat, how to use the [phone] process, and other ways she can make progress.    Maddelyn had been using the [phone] process at least a few times a week, but upon reviewing it today, it became clear that she has not been following it verbatim, and it seems that the process has usually been interrupted with negative thoughts that she is trying to manage.  After reviewing the process, she seemed to have a good understanding of how to proceed.    Progress Towards Goal(s):  In progress.   Nutritional Diagnosis:  Stable progress on NB-1.2 Harmful beliefs/attitudes about food or nutrition-related topics (use with caution) As related to eating disorder.  As evidenced by usual intake of 3 meals and a couple of snacks daily.    Intervention:  Nutrition counseling.  Handouts given during visit include:  AVS  Demonstrated degree of understanding via:  Teach Back   Monitoring/Evaluation:  Dietary intake, exercise, and body weight in 3 week(s).

## 2015-11-28 ENCOUNTER — Ambulatory Visit (INDEPENDENT_AMBULATORY_CARE_PROVIDER_SITE_OTHER): Payer: 59 | Admitting: Family Medicine

## 2015-11-28 DIAGNOSIS — F5001 Anorexia nervosa, restricting type: Secondary | ICD-10-CM

## 2015-11-28 NOTE — Patient Instructions (Addendum)
Food sources of thiamin: sunflower seeds, macadamia nuts, green peas, acorn squash, edamame, asparagus, navy beans.   Book recommendation: Mindsight by Lorie Apley (especially info about Attachment theory).    Goals: - Eat at least 3 REAL meals and 1-2 snacks per day.  Aim for no more than 5 hours between eating.  Eat breakfast within one hour of getting up.  Be sure to include SOME carb at breakfast, i.e., minimum of 30 g of carb.    - Use the [phone] four-step process at any times you feel anxious:  (1) Name it; (2) Frame it; (3) Envision; and (4) Intentional focus shifting.    - AND CONTINUE TO USE THE [place].

## 2015-11-28 NOTE — Progress Notes (Signed)
Medical Nutrition Therapy:  Appt start time: 1100 end time:  1200. PCP:   Dr. Maceo Pro [facility] FM) and Dr. Truett Mainland in [facility] (insur not covering now) Therapist:  Joanna Puff Psychiatrist:   Dr. Raelyn Number  Assessment:  Primary concerns today: anorexia nervosa, restricting type; F50.01.  Jonni's therapist at [facility], Sharyn Creamer Carney, had gave Kiahra the impression that she might have a dx of borderline personality d/o.  Prompted by that encounter of more than a year ago, Finesse only recently read the criteria for BPD in the DSM IV, which has prompted a lot of childhood memories that have been somewhat distressing to [place].  She has so far not discussed with her therapist Particia Lather.    Shatarra admitted today that she is starting to get a glimpse of what it might be like to accept her body as "normal" and to not have to be ultra-thin to be acceptable.  Her food choices, however, belie any normalized thinking (see 24-hr recall).    She said she has been using the Urge911 process - usually daily - and that she feels she is getting better at using it.  We did not get to this topic until late in her appt, so it's not clear in what situations Rosaelia has been using it, or to what effect.    24-hr recall:  (Up at 5:30 AM [couldn't sleep, as usual lately]) B (10:30 AM)-  (3 c water upon getting up) 4 crepes w/ kale & 2 T fruit compote, 2 oz chx, 1 oz chs Snk ( AM)-  water L (1 PM)-  Tea mix, 1 homemade choc (oil, cocoa, honey) (~65 kcal), 1 bite cucumber Snk (4 PM)-  4 crepes w/ kale & 2 T fruit compote, 2 oz chx, 1 oz chs D ( PM)-  water Snk (9:30)-  1 1/2 large sardines, 1/2 apple Typical day? Yes.  Recently, Ralph has been eating her homemade crepes usually twice a day.   Crepes recipe: 7 egg whites, 1 whole egg, 2 scoops pro powder ([employer] Premier; 25 g pro), oat bran, psyllium, ~1/2 c kefir, bak powder & bak soda.  Usually goes 1 recipe per day.    Progress Towards Goal(s):  In progress.   Nutritional  Diagnosis:  Stable progress on NB-1.2 Harmful beliefs/attitudes about food or nutrition-related topics (use with caution) As related to eating disorder.  As evidenced by usual intake of 3 meals and a couple of snacks daily.    Intervention:  Nutrition counseling.  Handouts given during visit include:  AVS  Demonstrated degree of understanding via:  Teach Back   Monitoring/Evaluation:  Dietary intake, exercise, and body weight in 3 week(s).

## 2015-12-19 ENCOUNTER — Encounter: Payer: Self-pay | Admitting: Family Medicine

## 2015-12-19 ENCOUNTER — Ambulatory Visit (INDEPENDENT_AMBULATORY_CARE_PROVIDER_SITE_OTHER): Payer: 59 | Admitting: Family Medicine

## 2015-12-19 DIAGNOSIS — F5001 Anorexia nervosa, restricting type: Secondary | ICD-10-CM | POA: Diagnosis not present

## 2015-12-19 NOTE — Progress Notes (Signed)
Medical Nutrition Therapy:  Appt start time: 1200 end time:  1300. PCP:   Dr. Maceo Pro [facility] FM) and Dr. Truett Mainland in [facility] (insur not covering now) Therapist:  Joanna Puff Psychiatrist:   Dr. Raelyn Number  Assessment:  Primary concerns today: anorexia nervosa, restricting type; F50.01.  Deirdra said life (and eating behaviors) has been pretty stable.  Jaquisha said she feels truly embarrassed that she is normal weight and has currently been eating somewhat normally.  She has been reading the book Mindsight by Delice Bison, which has helped her to understand some of the effects of the chaos and dysfunction of her childhood family life.  She is also encouraged by the book about the possibility of change, especially with respect to establishing fulfilling relationships.    Usual recent routine has been running ~30 min soon after Christinamarie gets up, followed by breakfast, which is delayed b/c of taking her Nature-thyroid right after she runs.  She insists she is not hungry right after running, another reason to delay eating.    Soffia has been frequently using the [phone] process for dealing with troubling thoughts, which she feels has been very helpful in calming her down and in making better choices.    Progress Towards Goal(s):  In progress.   Nutritional Diagnosis:  Stable progress on NB-1.2 Harmful beliefs/attitudes about food or nutrition-related topics (use with caution) As related to eating disorder.  As evidenced by usual intake of 3 meals and a couple of snacks daily.    Intervention:  Nutrition counseling.  Handouts given during visit include:  AVS  Demonstrated degree of understanding via:  Teach Back   Monitoring/Evaluation:  Dietary intake, exercise, and body weight in 2 week(s).

## 2015-12-19 NOTE — Patient Instructions (Addendum)
-   Take your Nature-thyroid before you go for a run in the AM.  Aim for breakfast no later than 30 min post-exercise (or at least no more than an hour post-exercise).    - Remainder of the day:  Aim for something to eat at least every 5 hours.    - REAL meals usually provide the best nutrition as well as satiety.   - Continue to use the Urge process daily, and feel free to incorporate Anola Gurney questions to analyze the identified thought.    1. Is it true?  2. Can you absolutely know it's true?  3. How do you react when you believe that thought?  4. Who would you be without that thought? - Finish reading [place].

## 2015-12-20 ENCOUNTER — Ambulatory Visit: Payer: 59 | Admitting: Family Medicine

## 2016-01-03 ENCOUNTER — Ambulatory Visit (INDEPENDENT_AMBULATORY_CARE_PROVIDER_SITE_OTHER): Payer: 59 | Admitting: Family Medicine

## 2016-01-03 DIAGNOSIS — F5001 Anorexia nervosa, restricting type: Secondary | ICD-10-CM | POA: Diagnosis not present

## 2016-01-03 NOTE — Progress Notes (Signed)
Medical Nutrition Therapy:  Appt start time: 1200 end time:  1300. PCP:   Dr. Maceo Pro [facility]) and Dr. Truett Mainland in [facility] (insur not covering now) Therapist:  Joanna Puff Psychiatrist:   Dr. Raelyn Number  Assessment:  Primary concerns today: anorexia nervosa, restricting type; F50.01.  Natalie Schmidt still complains of feeling hunger too frequently during the day.  This has gotten better, but she is still frustrated that she feels hungry so often.  We talked about the role her diet composition may play in this, i.e., while the foods she eat now may have satisfied her when she was starving herself, there are few foods she eats that provide real food pleasure or joy.  I reminded [name] that we are biologically wired to get pleasure from food, and denying herself this is likely to result in increased appetite.    24-hr recall (Up at 6:30 AM):  10:00 AM  1/4 crepe/wrap recipe (7 egg whts, 1 egg, 2 scoops pro pdr, 2/3 c oat bran)   1 oz cheese    6 oz greens   1 c 1:1 apples:zucchini    3 oz chicken   fruit puree 12:00 PM 3 T tea mix (1/2 pro pdr, cocoa pdr); homemade bon bon (3 cocoa:1 honey: 1 coconut oil) 1:30 PM  1/5 crepe/wrap recipe   1 c 1:1 apples:zucchini   1T macadamia nut butter 5:00 PM  1/4 crepe/wrap recipe    2 T hummus    6 oz greens    3 oz chicken 6:30 PM  1/2 peach   1 scoop protein (110 cal)   1/4 c kefir  Progress Towards Goal(s):  In progress.   Nutritional Diagnosis:  Stable progress on NB-1.2 Harmful beliefs/attitudes about food or nutrition-related topics (use with caution) As related to eating disorder.  As evidenced by usual intake of 3 meals and a couple of snacks daily.    Intervention:  Nutrition counseling.  Handouts given during visit include:  AVS  Demonstrated degree of understanding via:  Teach Back   Monitoring/Evaluation:  Dietary intake, exercise, and body weight in 2 week(s).

## 2016-01-03 NOTE — Patient Instructions (Addendum)
GOALS remain the same:  1. Eat at least 3 REAL meals and 1-2 snacks per day.    2. No more than 5 hours between eating.   3. Eat breakfast within one hour of getting up.   4. Include at least 30 g of carb with each meal.   5. Practice [phone] process as appropriate.   - Track your breakfast: WHAT, HOW MUCH, and WHAT TIME for about 2 weeks.   - Also record the time at which you first notice hunger.    - Look at the breakfast composition (and quantity) that allows you to feel satisfied longest.    - A well balanced breakfast will include ~30 g of carb and at least 20-25 grams of protein and at least 10 grams of fat.    - A reasonable goal for you is to "normalize" eating in terms of your diet composition.    - Eating more real food may actually help you manage your appetite best.    - Your challenge:  - Make some chili and GF cornbread this week.    - Pay attn to your level of satiety when you eat it, and following.    - Eventual goal: Obtain at least one REAL meal (in terms of "normal") per day.  - Real meal: Looks like, tastes like, feels like a real meal to a person who does not have an eating d/o.      - Email Jeannie to remind to send the cornbread recipe.

## 2016-01-11 ENCOUNTER — Ambulatory Visit: Payer: 59 | Admitting: Sports Medicine

## 2016-01-31 ENCOUNTER — Ambulatory Visit: Payer: 59 | Admitting: Family Medicine

## 2016-02-02 ENCOUNTER — Encounter: Payer: Self-pay | Admitting: Sports Medicine

## 2016-02-02 ENCOUNTER — Encounter: Payer: Self-pay | Admitting: Family Medicine

## 2016-02-02 ENCOUNTER — Ambulatory Visit (INDEPENDENT_AMBULATORY_CARE_PROVIDER_SITE_OTHER): Payer: 59 | Admitting: Family Medicine

## 2016-02-02 ENCOUNTER — Ambulatory Visit (INDEPENDENT_AMBULATORY_CARE_PROVIDER_SITE_OTHER): Payer: 59 | Admitting: Sports Medicine

## 2016-02-02 VITALS — BP 94/57 | Ht 63.0 in | Wt 125.0 lb

## 2016-02-02 DIAGNOSIS — F5001 Anorexia nervosa, restricting type: Secondary | ICD-10-CM

## 2016-02-02 DIAGNOSIS — M21612 Bunion of left foot: Secondary | ICD-10-CM

## 2016-02-02 DIAGNOSIS — M791 Myalgia, unspecified site: Secondary | ICD-10-CM | POA: Insufficient documentation

## 2016-02-02 HISTORY — DX: Myalgia, unspecified site: M79.10

## 2016-02-02 HISTORY — DX: Bunion of left foot: M21.612

## 2016-02-02 NOTE — Patient Instructions (Signed)
You are getting impingement at shoulder  This relates to position and pinching of rotator cuff  Try to correct position Do scapular exercises Up right row External fly Arm extended fly  Whole idea is to pinch shoulder blades  Most issues are lack of soft tissue recovery from prior time of imbalance  Key is recovery Tart cherry juic 4 to 8 oz Enough calories  Restart the activity with light weight Good form consistency

## 2016-02-02 NOTE — Patient Instructions (Addendum)
-   Create a system of accountability with your kids for using the Urge process.   - Use the Urge process several times a day.  Write your answers when you can, including a description of the visualization.    - Record breakfast each day:  What, how much, and what time.    Then also record time at which you first notice hunger.    Alternate three breakfasts discussed today:   Protein  Carb  Fat 1.  35 g  30 g  10 g    (< 5 g fiber)   2.  < 15 g  35 g  < 8 g      (< 5 g fiber)   3.  25 g   35 g  < 4 g      (< 5 g fiber)   - Take a calcium-magnesium supplement (500/250 mg) before bed / with last food of the evening.    - Reminder: Fake it till you become it: Make a point to get at least 45 minutes with a friend each week.    - Exercise motivation:  Do some daily visualizing to see yourself as strong, fit, and high-functioning.    - Email Jeannie sleep Rx; your accountability plan; and breakfast meals.

## 2016-02-02 NOTE — Assessment & Plan Note (Signed)
This likely contributed to her multiple muscle aches. Recommended continuing to eat enough calories to sustain her workouts.

## 2016-02-02 NOTE — Assessment & Plan Note (Signed)
Gave bunion padding to bilateral orthotics. Follow-up as needed

## 2016-02-02 NOTE — Assessment & Plan Note (Addendum)
Multiple muscle aches and pains from history of restrictive eating with exercising excessively. Suspect multiple aches her from being nutrition depleted insetting of the excessive exercise. Recommended time period of relative rest with some minimal exercises to help with strengthening of possible muscle imbalances.   Recommended tart cherry juice 4-8 ounces after working out to help with antioxidants.
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
## 2016-02-02 NOTE — Progress Notes (Signed)
Medical Nutrition Therapy:  Appt start time: 1200 end time:  1300. PCP:   Dr. Maceo Pro [facility]) and Dr. Truett Mainland in [facility] (insur not covering now) Therapist:  Joanna Puff Psychiatrist:   Dr. Raelyn Number  Assessment:  Primary concerns today: anorexia nervosa, restricting type; F50.01.  Deryn was accompanied by her mother, Ms. Dimas Aguas, today, who is visiting from [place] for a few  days.   Virtue has not seen therapist Joanna Puff in a couple of months.  Her depression has worsened, as has body dissatisfaction.  She is upset that I keep recommending breakfast within the first hour of getting up, especially since she insists that she hates food, and eating only reminds her of how fat she is, etc.  Although Brittnay is still exercising regularly, she has recently been struggling with exercise motivation, which she says is partly related to intense dislike of exercising in this body that now weighs more than she has before.    Madyx had found the Urge process to be useful to managing dysfunctional thoughts, but she has stopped using it in recent weeks.  In addn, despite encouragement to branch out to more "normal" meals, Kalleigh continues to use almost exclusively her recipes designed to minimize fat, carb's, and kcal.  Meal components are largely unrecognizable to most people as regular food, i.e., homemade  "chocolates"; crepes with pro powder & oat bran; fruit puree; veg's at each meal, including breakfast; and protein powder.    Loganne is currently running ~35 min 5 X wk, and lifting weights 3-4 X wk.  In addn, she does 60 sit-ups and 50 push-ups daily, and pull-ups throughout the day, usually a total of ~50.    Progress Towards Goal(s):  In progress.   Nutritional Diagnosis:  Regression on NB-1.2 Harmful beliefs/attitudes about food or nutrition-related topics (use with caution) As related to eating disorder.  As evidenced by continued intake of almost exclusively "concocted foods" of low caloric  density as well as food and weight anxiety.    Intervention:  Nutrition counseling.  Handouts given during visit include:  AVS  Demonstrated degree of understanding via:  Teach Back   Monitoring/Evaluation:  Dietary intake, exercise, and body weight in 2 week(s).

## 2016-02-21 ENCOUNTER — Ambulatory Visit: Payer: 59 | Admitting: Family Medicine

## 2016-02-26 ENCOUNTER — Encounter: Payer: Self-pay | Admitting: Sports Medicine

## 2016-03-08 ENCOUNTER — Ambulatory Visit (INDEPENDENT_AMBULATORY_CARE_PROVIDER_SITE_OTHER): Payer: 59 | Admitting: Family Medicine

## 2016-03-08 ENCOUNTER — Encounter: Payer: Self-pay | Admitting: Family Medicine

## 2016-03-08 DIAGNOSIS — F5001 Anorexia nervosa, restricting type: Secondary | ICD-10-CM

## 2016-03-08 NOTE — Patient Instructions (Addendum)
-   Email a 24-hr recall so I can see how your eating is going AND the three breakfasts you been having.    - Assumption:  I know what my biological predisposition is with respect to body shape and size as well as my metabolism.    - Use the [phone] process of challenging invasive thoughts:  (1) Name it; (2) Frame it; (3) Envision; and (4) Intentional focus shifting.  Write down your answers whenever possible, and bring them to next appt.

## 2016-03-08 NOTE — Progress Notes (Addendum)
Medical Nutrition Therapy:  Appt start time: 1200 end time:  1300. PCP:   Dr. Maceo Pro [facility]) and Dr. Truett Mainland in [facility] (insur not covering now) Therapist:  Joanna Puff Psychiatrist:   Dr. Raelyn Number  Assessment:  Primary concerns today: anorexia nervosa, restricting type; F50.01.  Demetris's psychiatrist prescribed Mirapex for mood regulation, which she started early Oct.  Yesterday Goldy saw Dr. Imogene [facility]] in [place], to whom she talked about - among other things - her recent insomnia (unable to go back to sleep after 1 AM or earlier some nights).  He provided some suggestions related to sleep hygiene, which she will try.    Kaina did experiment with different breakfasts, but said she did not calculate the specific grams of protein, carb, and fats (said it was overwhelming - even though it was Azyria who pressed me for these details).  She said her frequency of hunger seems to have diminished a bit.  As Antaniyah has thought about it, she has come to agree that her eating d/o is a manifestation of her low self-regard.  Jamel still has to try to ignore her appearance with respect to exercise, b/c she so hates her current body, although she dismisses the several-pound weight loss she has apparently experienced in the past couple of months.  She will not get weighed at her appts, but reports a recent weight check of 118 lb, down from 125 as recently as 5 weeks ago.    Omega said she could not remember her intake from yesterday, so she will email a 24-hr recall for my review.  I insisted on this b/c of her weight loss.    Progress Towards Goal(s):  In progress.   Nutritional Diagnosis:  Regression on NB-1.2 Harmful beliefs/attitudes about food or nutrition-related topics (use with caution) As related to eating disorder.  As evidenced by continued intake of almost exclusively "concocted foods" of low caloric density as well as food and weight anxiety.     Intervention:  Nutrition counseling.  Handouts given during visit include:  AVS  Demonstrated degree of understanding via:  Teach Back   Monitoring/Evaluation:  Dietary intake, exercise, and body weight in 1 week(s).

## 2016-03-13 ENCOUNTER — Other Ambulatory Visit: Payer: 59 | Admitting: Sports Medicine

## 2016-03-15 ENCOUNTER — Ambulatory Visit: Payer: Self-pay

## 2016-03-15 ENCOUNTER — Encounter: Payer: Self-pay | Admitting: Sports Medicine

## 2016-03-15 ENCOUNTER — Ambulatory Visit (INDEPENDENT_AMBULATORY_CARE_PROVIDER_SITE_OTHER): Payer: 59 | Admitting: Family Medicine

## 2016-03-15 ENCOUNTER — Ambulatory Visit (INDEPENDENT_AMBULATORY_CARE_PROVIDER_SITE_OTHER): Payer: 59 | Admitting: Sports Medicine

## 2016-03-15 ENCOUNTER — Encounter: Payer: Self-pay | Admitting: Family Medicine

## 2016-03-15 ENCOUNTER — Other Ambulatory Visit: Payer: 59 | Admitting: Sports Medicine

## 2016-03-15 VITALS — BP 94/66 | Ht 63.0 in | Wt 118.0 lb

## 2016-03-15 DIAGNOSIS — M25522 Pain in left elbow: Principal | ICD-10-CM

## 2016-03-15 DIAGNOSIS — M25521 Pain in right elbow: Secondary | ICD-10-CM

## 2016-03-15 DIAGNOSIS — M791 Myalgia, unspecified site: Secondary | ICD-10-CM

## 2016-03-15 DIAGNOSIS — F5001 Anorexia nervosa, restricting type: Secondary | ICD-10-CM

## 2016-03-15 NOTE — Assessment & Plan Note (Signed)
I suspect her muscle pain relates primarily to the top workout she does She is inconsistent with her exercise pattern and needs to make this more formal  she needs to do recovery nutrition after Exercises and I gave her some tips for that  I emphasized that it is important to keep working with nutrition and keep control of her eating disorder

## 2016-03-15 NOTE — Patient Instructions (Addendum)
Recovery nutrition:  Aim for a carb:protein ratio of 4:1 following aerobic exercise, and 2 or 3:1 for resistance exercise.    Email Jeannie reminder re. HIIT exercise article, which I will send you.     Your food recall suggests: - Not normal eating; not "real" food - Waited too long to eat in the AM.  - Not ENOUGH food.    - Use the [phone] four-step process of challenging invasive thoughts:  (1) Name it; (2) Frame it; (3) Envision; and (4) Intentional focus shifting.  - Get consistent with taking your Prometrium, as well as your other med's and supplements.    Food goals:   Consider what total day's intake would be of 3 REAL meals and 1-2 snacks, and divide that food into 5-7 eating times during the day, i.e., ~2000 calories (more food than you've been getting).

## 2016-03-15 NOTE — Assessment & Plan Note (Signed)
She is seeing Dr. Jenne Campus and asked her to continue this  Work on balancing her nutrition to replace calories burned in exercise

## 2016-03-15 NOTE — Progress Notes (Signed)
Medical Nutrition Therapy:  Appt start time: 1200 end time:  1300. PCP:   Dr. Maceo Pro [facility]) and Dr. Truett Mainland in [facility] (insur not covering now) Therapist:  Joanna Puff Psychiatrist:   Dr. Raelyn Number  Assessment:  Primary concerns today: anorexia nervosa, restricting type; F50.01.  Dannan again refused a weight check, but thinks she is still ~118 lb, as reported last week.  We reviewed both Marcos's food recall (reflecting abnormal eating) and her attempt to use the Urge 911 process for challenging derailing thoughts.  Theona insists that her body can get by on fewer kcal, that she hates the feeling of food in her stomach, and what she is currently eating is adequate.  She is not actually using the Urge process as intended, but has modified it in such a way that more supports her dysfunctional thinking and behavior rather than deters it.  We talked at length about this, and I reiterated the importance of ""following the script" when using the process.   At today's sports med appt, Dr. Oneida Alar encouraged Deneise Lever to be sure to eat post-exercise to support muscle synthesis and repair, which may speed healing of the injuries she is currently dealing with (R wrist, L elbow).    24-hr recall: 6 a.m. wake 10 a.m. handful (10 almonds, 2 fireballs) 11:00 a.m.  "cereal" - ingredients: 1/2c kefir 1t colostrum, 3 t berry compote (0 sugar), small handful (3T-1/4c) oats, small handful oat bran, sprinkle sunflower seeds, 1T flax seeds 2:20 p.m. 1 hersheys gold nugget bar (bite size) 6:00 p.m.  1.25 crepes, 4 oz? lamb, bag greens, 1/2 bon bon, 2T ice cream, 2x1x1/2 tiramisu, 1 scoop cocoa mix (3T?)  Progress Towards Goal(s):  In progress.   Nutritional Diagnosis:  No progress on NB-1.2 Harmful beliefs/attitudes about food or nutrition-related topics (use with caution) As related to eating disorder.  As evidenced by continued intake of almost exclusively "concocted foods" of low caloric density as well as food  and weight anxiety.    Intervention:  Nutrition counseling.  Handouts given during visit include:  AVS  Demonstrated degree of understanding via:  Teach Back   Monitoring/Evaluation:  Dietary intake, exercise, and body weight in 5 week(s).

## 2016-03-15 NOTE — Patient Instructions (Signed)
You have evidence of muscular strain but not a tear or injury on both forearms Right wrist you are also getting strain to joint capsule laterally  My suggestions is that after weight lifting for power Try to get roughly 24 grams of CHO and 6 grams of protein Golden hour rule  Then for that you need also range of 8 to 16 ounces of hydration  For a longer less intense workout - you need to add more fat and less protein Sheila Oats is good Coconut oil  Rule for endurance is 100 calories for every mile equivalent to your basal diet  Left arm it is wrist extension and forearm rotation  Right arm - weight bearing with wrist extended or with wrist turned out

## 2016-03-15 NOTE — Progress Notes (Signed)
Chief complaint--bilateral forearm pain  Patient does extensive exercise including running most mornings She does weight exercises when she gets the chance but does not have a regular pattern She has 5 children and is married and works around family schedule  She has an eating disorder and works with Iver Nestle and with psychiatry She feels that exercise is a must for her having control of her life  Pain localizes to the left lateral epicondyle and the forearm muscles Pain localizes to the right lateral wrist and distal forearm muscles  Past history Chief medical issue is anorexia nervosa restricting type  Review of systems No neck pain No radicular symptoms No numbness in hands  Physical exam Muscular female in no acute distress BP 94/66   Ht 5\' 3"  (1.6 m)   Wt 118 lb (53.5 kg)   BMI 20.90 kg/m   Left elbow real some tenderness at the lateral epicondyle There is full range mofion of the elbow Mild tenderness of the extensor muscles  Right elbow shows full range of motion No tenderness over the epicondyle Mild point tenderness at the distal forearm but I feel more on the dorsal side  Full range of motion of both wrists  [facility] ultrasound Of right and left forearm  The left lateral epicondyle is normal without any evidence of tear The proximal muscles of the left extensor compartment show some very mild hypoechoic change Additional scanning of the left elbow and forearm is normal  Right lateral epicondyle is normal Mild hypoechoic change in the distal forearm muscles extensor compartment Normal appearance of radius and ulna  Summary: she has some mild muscular edema that would be consistent with having done recent weight workouts; No evidence of medial or lateral epicondylitis or muscle tear  Ultrasound and interpretation by Leane Call.D.

## 2016-04-18 ENCOUNTER — Other Ambulatory Visit: Payer: Self-pay | Admitting: *Deleted

## 2016-04-19 ENCOUNTER — Other Ambulatory Visit: Payer: Self-pay | Admitting: *Deleted

## 2016-04-19 MED ORDER — LIDOCAINE-PRILOCAINE 2.5-2.5 % EX CREA: 1.0000 "application " | TOPICAL_CREAM | CUTANEOUS | 1 refills | Status: DC | PRN

## 2016-04-23 ENCOUNTER — Ambulatory Visit: Payer: 59 | Admitting: Family Medicine

## 2016-05-10 ENCOUNTER — Other Ambulatory Visit: Payer: Self-pay | Admitting: *Deleted

## 2016-05-10 MED ORDER — LIDOCAINE-PRILOCAINE 2.5-2.5 % EX CREA: 1.0000 "application " | TOPICAL_CREAM | CUTANEOUS | 1 refills | Status: DC | PRN

## 2016-05-15 ENCOUNTER — Ambulatory Visit (INDEPENDENT_AMBULATORY_CARE_PROVIDER_SITE_OTHER): Payer: 59 | Admitting: Family Medicine

## 2016-05-15 ENCOUNTER — Encounter: Payer: Self-pay | Admitting: Family Medicine

## 2016-05-15 DIAGNOSIS — F5001 Anorexia nervosa, restricting type: Secondary | ICD-10-CM

## 2016-05-15 NOTE — Progress Notes (Signed)
Medical Nutrition Therapy:  Appt start time: 1100 end time:  1200. PCP:   Maurice Small, MD; Sadie Haber at [place] Therapist:  Joanna Puff Psychiatrist:   Raelyn Number, MD; [email or web address]  Assessment:  Primary concerns today: anorexia nervosa, restricting type; F50.01.  Sherman again refused a weight check, and while she is sure she gained weight over the holidays, she has not recently weighed herself.  Kaylyn fell on Sat, and hurt her R foot as well as bruised or broke her tailbone, so she has not been able to exercise, which has been hard for her.  She has not been sleeping well; getting no more than 3-6 hrs of sleep for the past few months.   Teneka has been eating more "real meals," more often, and more sweets recently, which also frustrates her.  She can't manage to get her eating back on track.  She has not been doing the [phone] process, even though she acknowledges that it was helpful when she was doing it months ago.  Discussed today how much of her behavioral choices as well as increased depression may stem from sleep deprivation.     In conversation with her husband this morning, Oaklynn told him of the four things she especially hates in her life:  body fat, my kids' chaotic and abberent behaviors, food, and her husband's not having time for her.   24-hr recall:  (Up at 5 AM; got kids up at 6; did housework; made the day's to-do list) B (9:30 AM)-  1/4 c raw oats, oat bran, 1/2 c blueberries, 2 T colostrum, 2 t flax meal, 1 scoop pro powder (24 g pro), 1/2 c kefir, 1 bite pb cup, 1 bite choc, tea Snk ( AM)-  --- L (12:30 PM)-  1 large spinach salad, 1 oz bl chs, 2 c str'ber's, 6 oz chx Snk (4:30)-  Something; can't remember D (8 PM)-  1 rice cake, 1/2 chx breast, 1 tsp bleu chs, sprouts Snk (11:30)- 1/2 chx brst, 1 bite pb cup, 1 bite choc, 2-3 pineapple chunks,    Typical day? Yes.  since Christmas time.   Progress Towards Goal(s):  In progress.   Nutritional Diagnosis:   Some progress on NB-1.2 Harmful beliefs/attitudes about food or nutrition-related topics (use with caution) As related to eating disorder.  As evidenced by more "normal" choice of foods at meals, although still abnormal eating pattern and expressed dislike of and anxiety about food/eating.    Intervention:  Nutrition counseling.  Handouts given during visit include:  AVS  Demonstrated degree of understanding via:  Teach Back   Monitoring/Evaluation:  Dietary intake, exercise, and body weight in 5 week(s).

## 2016-05-15 NOTE — Patient Instructions (Addendum)
Goals: 1. Bedtime no later than 10:30 PM (10 PM is shut-down time).   - First thing in the morning, make a list of must-do items for the next day.   2. Eat at least 3 REAL meals and 1-2 snacks per day.  Aim for no more than 5 hours between eating.  Eat breakfast within one hour of getting up.  3. Get vegetables at both lunch and dinner.   4. Physical activity:  Try the elliptical.  Another option: Walking / swimming.     Use the Urge process as appropriate.    Read the Sleep handout, as well as the book, The Pocket Guide to the [employer]: The Transformative Power of Feeling Safe by [employer].

## 2016-06-19 ENCOUNTER — Ambulatory Visit (INDEPENDENT_AMBULATORY_CARE_PROVIDER_SITE_OTHER): Payer: 59 | Admitting: Family Medicine

## 2016-06-19 ENCOUNTER — Encounter: Payer: Self-pay | Admitting: Family Medicine

## 2016-06-19 DIAGNOSIS — F5001 Anorexia nervosa, restricting type: Secondary | ICD-10-CM | POA: Diagnosis not present

## 2016-06-19 NOTE — Patient Instructions (Addendum)
-   John's Killer Protein has no sweetener added.    - Consider your thoughts about muscle:  What role does a muscular, strong body play in successful aging?  - Cravings tend to go away when you eat with regularity and when you get balanced meals.    - How to get yourself to eat veg's:  Give some thought to what might appeal to you, and prepare some veg's to try, e.g., roasted veg's or veg soups.  (Or try steaming cauliflower, then pureeing with goat cheese.)  - Make a list of vegetables you will want to try.  Keep this list accessible.  (Keep as a basis for shopping as well, so you have ingredients at any given time.)  - PLAN WHEN YOU WILL DO SOME COOKING.  I encourage you to sit down with the family for dinner!  - URGE PROCESS more consistently!!   Eliza's appts:  Thursdays, May 3 at 1:30, 10th at 2:30, 17th at 1:30, and Tue, May 22 at 1:30.

## 2016-06-19 NOTE — Progress Notes (Signed)
Medical Nutrition Therapy:  Appt start time: 1100 end time:  1200. PCP:   Natalie Small, MD; Natalie Schmidt at [facility] Therapist:  Joanna Schmidt Psychiatrist:   Raelyn Number, MD; [email or web address]  Assessment:  Primary concerns today: anorexia nervosa, restricting type; F50.01.  Natalie Schmidt again refused a weight check.  She said she has been sleeping better, and getting to bed earlier, usually by 10:30. Her daughter Natalie Schmidt has been helping to remind her of bedtime.   She has been pretty successful in making her to-do list early in the day, which means not needing to catch up on what she hasn't gotten done by bedtime.  [name] has not been happy with her eating, especially dealing with feeling drawn to junk foods, which is new to her.  She still doesn't enjoy eating, but is finding her appetite often feels out of control.    [name] said she does not want to and is not eating good meals for the following reasons:  - she feels fat; doesn't deserve to eat - no food tastes good - not exercising well*, so doesn't deserve to eat - not inspired; feels depressed; doesn't want to make a mess in the kitchen; not worth the effort  *Injuries are all resolved except for left arm pain.    We discussed the above at length; seems much of Natalie Schmidt's resistance is tied up with feeling undeserving, a familiar struggle she has had pretty much all her life, although not always recognized.    24-hr recall:  (Up at  AM) B ( AM)-  1/2 c kefir, 1/4 c oats, 1 tbsp sunflwr sds, 1-2 tbsp colostrum, 1 scoop pro pwdr (12 g pro), 2 egg whites, 1/2 grated apple, 2-3 tsp psyllium fiber, ice (= ~16 oz total), 1 fire ball Snk ( AM)-  2 fire balls L (1 PM)-  2 rice cakes, 1 tbsp peanut butter, lettuce, 1/2 grated apple, 3 oz chx,  Snk ( PM)-  1 homemade zucchini muffin, 1 tsp coconut oil, hot cocoa mix (incl's pro powder [12 g pro]& tea) Snk ( PM)-  2 fireballs, and ?? D (7 PM)-  1/2 pie pan of homemade lemon bars Snk (10 PM)-  1/2  pan of homemade lemon bars  Typical day? Yes.  , although possibly a little more junk food yesterday than usual.  Dinner has often been of poor nutritional quality, e.g., chx with lettuce and cheese, then something sweet.   Crust: 1 1/2 c ground oats, few tbsp butter, 1 tbsp sugar Filling: lemon juice, 1/3 c sugar, egg yolks   Progress Towards Goal(s):  In progress.   Nutritional Diagnosis:  Little progress on NB-1.2 Harmful beliefs/attitudes about food or nutrition-related topics (use with caution) As related to eating disorder.  As evidenced by more "normal" choice of foods at meals, although still abnormal eating pattern and expressed dislike of and anxiety about food/eating.    Intervention:  Nutrition counseling.  Handouts given during visit include:  AVS  Demonstrated degree of understanding via:  Teach Back   Monitoring/Evaluation:  Dietary intake, exercise, and body weight in 4 week(s).

## 2016-06-21 ENCOUNTER — Ambulatory Visit (INDEPENDENT_AMBULATORY_CARE_PROVIDER_SITE_OTHER): Payer: 59 | Admitting: Family Medicine

## 2016-06-21 VITALS — BP 90/60 | HR 86 | Temp 98.0°F

## 2016-06-21 DIAGNOSIS — F5001 Anorexia nervosa, restricting type: Secondary | ICD-10-CM

## 2016-06-21 DIAGNOSIS — Z975 Presence of (intrauterine) contraceptive device: Secondary | ICD-10-CM

## 2016-06-23 ENCOUNTER — Encounter: Payer: Self-pay | Admitting: Family Medicine

## 2016-06-23 NOTE — Progress Notes (Signed)
Patient here for second opinion / discussion of hormone status, possible need for replacement.   She relates a lot of psychlogical distress from thoughts of her body even "having" hormones; associates all hormines, including physiologic, as causing her to feel fat, causing her to menstruate and sweat. She reports at her most restrictive episodes she was happy that even her saliva amount had decreased.  After that severe episode of restriction, where she dropped to approximately 100 ponds by her report, she has made progress with her eating issues. As she started to gain weight she began t have menstrual spotting which distressed her. She decided to have Mirena placed. Also reports having pap smear at that medical encounter that was reported ly normal,  These procedures were done at outside medical facility and she brings records which we are allowed to view (although I did not see official report of pap or specific date of IUD insertion; she reports both on or around October 2016 at [facility]).  Since placement of Mirena IUD she has had no spotting or vaginal bleeding. She recently had DEXXAscan but I do not have report. She tells me she was on low end of normal).  I was able to view recent labs showing normal estradiol and progesterone level.  Marland Kitchen PERTINENT  PMH / PSH: I have reviewed the patient's medications, allergies, past medical and surgical history. Pertinent findings that relate to today's visit / issues include: Eating disorder, restrictive type, not currently in restrictive phase Presence of progesterone containing IUD (Mirena) Not currently sexually active Married  OBJ: Patient appears well developed and in relatively normal range of weight by visual assessment only. No other physical  examination was done. Patient refused weigt assessment.She appears to have normal skin and nails and hair by gross inspection.  He speech is normal in content and fluency. She does not appear  agitated or to have any psychomotor retardation. She was initially somewhat unhappy to speak wit the reisdent working with me but once we spoke as a group of 3 she was more forthcoming and cooperative.  ASSESSMENT: 1. Hormone status: given review of recent labs for estradiol and progesterone, and the history that she was starting to menstruate, I suspect she is in a relatively good weight and energy balance, certainly compared to what she describes as previous levels. She is no longer menstruating, most likely secondary to presence of progesterone containing IUD. I would not recommend any additional hormone replacement or intervention at this time. I would recommend closely following her weight,  Bone density etc as is being done very well byher other specialists.  We had a long discussion about  "bio-identical " hormones and I gave her my opinions on thise making quite sure she was aware these were opinion as the evidence srtrounding those medicines is not robust.  I answered all of her questions. I would be happy to see her back at any time. If indeed her pap was done in 2016 and was normal, and if there is no other history thaat would change recommended guidelines, she would be due next screening pap smear in 2021 (5 years). Mirena IUD should be removed in 5 years as well.  Greater than 50% of our 40 minute office visit was spent in counseling and education regarding these issues.

## 2016-07-02 ENCOUNTER — Ambulatory Visit: Payer: 59 | Admitting: Family Medicine

## 2016-07-16 ENCOUNTER — Ambulatory Visit: Payer: 59 | Admitting: Family Medicine

## 2016-08-28 ENCOUNTER — Ambulatory Visit: Payer: 59 | Admitting: Family Medicine

## 2016-09-18 ENCOUNTER — Encounter: Payer: Self-pay | Admitting: Family Medicine

## 2016-09-18 ENCOUNTER — Ambulatory Visit (INDEPENDENT_AMBULATORY_CARE_PROVIDER_SITE_OTHER): Payer: 59 | Admitting: Family Medicine

## 2016-09-18 DIAGNOSIS — F5001 Anorexia nervosa, restricting type: Secondary | ICD-10-CM

## 2016-09-18 NOTE — Progress Notes (Signed)
Medical Nutrition Therapy:  Appt start time: 1100 end time:  1200. PCP:   Maurice Small, MD; Sadie Haber at [place] Therapist:  Joanna Puff Psychiatrist:   Raelyn Number, MD; [email or web address]  Assessment:  Primary concerns today: anorexia nervosa, restricting type; F50.01.  Natalie Schmidt again refused a weight check.  Her daughter Natalie Schmidt is home from college, which is challenging in its own way for Natalie Schmidt b/c she finds it frustrating that her daughter is not especially motivated to make changes identified that would help her manage her weight and health.    Natalie Schmidt has been stressed about her current eating behaviors, which include, for the first time, junk foods and sweets, especially from afternoon to evening.  She is distressed that she no longer can manage to resist those foods as she did so easily for years during the height of her anorexia.  Natalie Schmidt said she is the biggest she has ever been right now, which she perceives as extremely overweight.    Natalie Schmidt continues to insist that she hates food, and everything about it.  She sees her "giving in" to eating junk foods as being weak, and this bothers her immensely.  We again discussed the notion of not allowing herself pleasure from food as driving of these food cravings.  Much of what Natalie Schmidt eats is compromised in terms of seasoning, sweetness, or taste appeal; it is not surprising that she is finally drawn to foods that do actually taste good.    Compounding this problem is Natalie Schmidt's sleep deprivation.  She is usually getting only 4-5 hours of sleep per night; wakes early, and cannot go back to sleep.    24-hr recall:  (Up at 3 AM) B (4 AM)-  1 chx thigh, 8 almonds, 3 tbsp dried greens, protein drink (1/2 dose pro powder in tea) Snk ( AM)-  --- L (12 PM)-  corn chips, lentil dip, cheese, chx, 1/4 lemon muffin, 4 peanut M&Ms, protein-tea drink Snk (3 PM)-  1 apple, almonds, chx, 1/2 brownie D (6 PM)-  1 salad, 3 oz chx, avocado, water Snk (9:30)-  3  pcs fruit danish, some other snacks, ice cream Typical day? Yes.    Progress Towards Goal(s):  In progress.   Nutritional Diagnosis:  Little progress on NB-1.2 Harmful beliefs/attitudes about food or nutrition-related topics (use with caution) As related to eating disorder.  As evidenced by more "normal" choice of foods at meals, although still abnormal eating pattern and expressed dislike of and anxiety about food/eating.    Intervention:  Nutrition counseling.  Handouts given during visit include:  AVS  Demonstrated degree of understanding via:  Teach Back   Monitoring/Evaluation:  Dietary intake, exercise, and body weight in 4 week(s).

## 2016-09-18 NOTE — Patient Instructions (Addendum)
-   Consider the concept that appetite is cumulative.  In other words, the longer you deny yourself, the more you set yourself up for feeling out of control with respect to appetite.    - As discussed before, I believe you are denying yourself satisfaction from food, which keeps your appetite going throughout the day.   GOALS: 1. Eat at least 3 REAL meals and 1-2 snacks per day.  Aim for no more than 5 hours between eating.  Eat breakfast within one hour of getting up.  A REAL meal includes at least some protein, some starch, and vegetables and/or fruit.   2. Set aside at least a few minutes each week planning for at least some of the week's meals.    Make a list of 7-10 dinner meals that taste good, are relatively quick and easy to prepare, and that meet your nutritional needs.  Use this as a basis for shopping, so you can make one of these meals any time.  Bring your list to your follow-up appointment for review.    NEXT VISIT: - What to do with afternoon cravings. [Consider what you buy, and keep in the house.  Try to keep out of the house foods you are most vulnerable to.]  - USE THE URGE PROCESS FOR DEALING WITH CRAVINGS! - How to change taste preferences. - Weight training.

## 2016-09-27 ENCOUNTER — Ambulatory Visit (INDEPENDENT_AMBULATORY_CARE_PROVIDER_SITE_OTHER): Payer: 59 | Admitting: Family Medicine

## 2016-09-27 ENCOUNTER — Ambulatory Visit: Payer: 59 | Admitting: Family Medicine

## 2016-09-27 ENCOUNTER — Encounter: Payer: Self-pay | Admitting: Family Medicine

## 2016-09-27 DIAGNOSIS — F5001 Anorexia nervosa, restricting type: Secondary | ICD-10-CM | POA: Diagnosis not present

## 2016-09-27 NOTE — Progress Notes (Signed)
Medical Nutrition Therapy:  Appt start time: 1100 end time:  1200. PCP:   Natalie Small, MD; Natalie Schmidt at [place] Therapist:  Joanna Schmidt Psychiatrist:   Raelyn Number, MD; [email or web address]  Assessment:  Primary concerns today: anorexia nervosa, restricting type; F50.01.  Natalie Schmidt again refused a weight check, but said her weight at home has fluctuated between 117 and 120.  She is feeling very distressed, wondering if she will ever be able to have a normal relationship with food.  Natalie Schmidt is so uncomfortable in her body at this time that she has trouble even exercising.  She shared today that her mother has admitted that when Natalie Schmidt was a child, she fed her (and her siblings) to keep her quiet.  Natalie Schmidt wonders about the effects of this on her eventual relationship with food.    Natalie Schmidt is still getting between 2 and 5 hours of sleep per night, which has been going on for months.  Natalie Schmidt has been using Uvex glasses (recommended by her psychiatrist Natalie Schmidt) to filter out blue light after ~9:30 PM, which she believes help her fall asleep, but there are few other tangible steps she has taken to reduce her insomnia.    In addition, there are multiple sources of stress in the home, which I will not document here.  We talked about sleep being a main priority, which likely impacts all other aspects of her life.  Her eating behaviors that are so troubling to her are also self-enforcing in that high-sugar, hyper-palatable foods promote appetite for more of the same.    Natalie Schmidt could not remember yesterday's intake well:  (Up at4 AM) B (6 AM)-  1/2 c raw cauliflower, 5-10 almonds, protein-tea mix Snk (9 AM)-  2 homemade hi-protein muffins L ( PM)-  ??? Ate every 2 hrs or so till dinner Snk ( PM)-  1 homemade hi-protein muffin D (5:30 PM)-  1 tuna sal, cheese melt on 2 large slc Ital bread, 1 rie cake, 1/4 c ice crm (130 kcal) Snk (10 PM)-  2 hazelnut choc's, 1/4 of tuna melt above,  Snk (1 AM)-  1  c tea w/ 3 oz milk, protein powder (~15 g protein) Typical day? Yes.    Progress Towards Goal(s):  In progress.   Nutritional Diagnosis:  Little progress on NB-1.2 Harmful beliefs/attitudes about food or nutrition-related topics (use with caution) As related to eating disorder.  As evidenced by more "normal" choice of foods at meals, although still abnormal eating pattern and expressed dislike of and anxiety about food/eating.    Intervention:  Nutrition counseling.  Handouts given during visit include:  AVS  Demonstrated degree of understanding via:  Teach Back   Monitoring/Evaluation:  Dietary intake, exercise, and body weight in 4 week(s).

## 2016-09-27 NOTE — Patient Instructions (Addendum)
-   I believe that lack of sleep is a priority to address right now.  See the handout provided today, and try at least some of the suggestions on it.   - Another barrier to your making food choices you are comfortable with (i.e., you consider nutritious) is two-fold:  1. Your meals and snacks are nutritionally imbalanced.  (When getting too much carb, you will likely continue to want more carb, and you will be hungry sooner than if the meal is balanced.)  2. Many times during the day what you eat is not especially satisfying.  Even the hyper-palatable foods are not if you are eating with extreme guilt.   - Your current level of stress related to home and family is may also be contributing to your "need" for comfort (sweet) foods.     What this means is it's important to figure out, plan for, and eat meals that satisfy.   - If you will complete the lists of protein, starch, and veg's, and email it to me, I will create some suggested meals from those lists.  NEXT VISIT: We'll discuss wt training.

## 2016-11-20 ENCOUNTER — Ambulatory Visit (INDEPENDENT_AMBULATORY_CARE_PROVIDER_SITE_OTHER): Payer: 59 | Admitting: Family Medicine

## 2016-11-20 DIAGNOSIS — F5001 Anorexia nervosa, restricting type: Secondary | ICD-10-CM

## 2016-11-20 NOTE — Progress Notes (Signed)
Medical Nutrition Therapy:  Appt start time: 1100 end time:  1200. PCP:   Maurice Small, MD; Sadie Haber at [place] Therapist:  Joanna Puff Psychiatrist:   Raelyn Number, MD; [email or web address]  Assessment:  Primary concerns today: anorexia nervosa, restricting type; F50.01.  Natalie Schmidt again refused a weight check, but said her weight at home was 114 lb.  She was [facility] for 6 wks with 3 of her children.  While there, she did not exercise at all, the first time in her adult life she has not exercised for more than a few days at a time.  The main reason she didn't exercise was that she is feeling severely depressed, which continues.  She does see Dr. Nat Christen tomorrow.  She has not seen therapist Joanna Puff in months, and is not inclined to continue with her at this time.  [name] believes her depression is related to difficulties she has with 3 of her 5 children, as well as marital discord.  In addn, and significantly, [name] is still very distressed at her own body size and appearance since weight gain that has been part of her eating d/o recovery.    [name]'s sleep has improved; usually sleeping 7 hours a night, roughly 10:30 PM-5:30 AM, which started about when she went to [facility].    No 24-hr recall today, but [name] said she is eating more "real foods" rather than many of her concoctions designed to be low in fat, sugar, and/or starch.  She feels simultaneously good and bad about this development.    Progress Towards Goal(s):  In progress.   Nutritional Diagnosis:  Some limited progress on NB-1.2 Harmful beliefs/attitudes about food or nutrition-related topics (use with caution) As related to eating disorder.  As evidenced by more "normal" choice of foods at meals, although still abnormal eating pattern and expressed dislike of and anxiety about food/eating.    Intervention:  Nutrition counseling.  Handouts given during visit include:  AVS  Demonstrated degree of  understanding via:  Teach Back   Monitoring/Evaluation:  Dietary intake, exercise, and body weight in 4 week(s).

## 2016-11-20 NOTE — Patient Instructions (Addendum)
-   Reminder:  Eating REAL meals consistently helps to moderate and manage appetite.    - Complete (again) the food lists, and:  - Email the lists to [place].  - Design some meals from your lists for Jarrett Soho (email this as well) for review.   - Let me know the results of your appt with Dr. Nat Christen.    - ORDER the [place].  Review the email re. the elbow exercises you should do.    - Exercise: - MWF:  Resistance training - TTHS:   Month 1: Run & swim 2 X wk; Insanity 1 X wk   Month 2: Ellipt 1 X wk; Row + HIITs 1 X wk; Insanity 1 X wk

## 2016-12-06 ENCOUNTER — Ambulatory Visit (INDEPENDENT_AMBULATORY_CARE_PROVIDER_SITE_OTHER): Payer: 59 | Admitting: Family Medicine

## 2016-12-06 DIAGNOSIS — F5001 Anorexia nervosa, restricting type: Secondary | ICD-10-CM

## 2016-12-06 NOTE — Progress Notes (Signed)
Medical Nutrition Therapy:  Appt start time: 1100 end time:  1200. PCP:   Maurice Small, MD; Sadie Haber at [place] Therapist:  Joanna Puff Psychiatrist:   Raelyn Number, MD; [email or web address]  Assessment:  Primary concerns today: anorexia nervosa, restricting type; F50.01.  Natalie Schmidt again refused a weight check.  She still c/o disliking the act of eating; hates to feel hunger and also hates to feel sensation of food in her stomach.  There is clearly much perseverating about food, what to eat, how much, when, and what eating will do to her, including how it affects her weight.    [name] is getting about 5 1/2 hrs of sleep nightly; usually waking around 3:30 or 4:30 AM.  Usually experiences a lot of hunger soon after waking, but is resistant to eating at this time, so usually doesn't eat till at least 7 AM.    [name] has been exercising 4-5 X wk, and is starting to feel stronger and better.  Said she has even started having a "glimpse" of enjoying exercise again.    Progress Towards Goal(s):  In progress.   Nutritional Diagnosis:  Stable progress on NB-1.2 Harmful beliefs/attitudes about food or nutrition-related topics (use with caution) As related to eating disorder.  As evidenced by more "normal" choice of foods at meals, although still abnormal eating pattern and expressed dislike of and anxiety about food/eating.    Intervention:  Nutrition counseling.  Handouts given during visit include:  AVS  Demonstrated degree of understanding via:  Teach Back   Monitoring/Evaluation:  Dietary intake, exercise, and body weight in 4 week(s).

## 2016-12-06 NOTE — Patient Instructions (Addendum)
-   Write down any/all of the thoughts you have with respect to food at a time you notice these thoughts dominating your thinking.  Bring this list (these lists) to follow-up appt for review.    - Design THREE meals that satisfy nutrition principles, will likely taste good, and will be relatively easy to prepare.  Email to [email or web address].  - USE THE URGE PROCESS FOR MANAGING INVASIVE, DERAILING THOUGHTS.  WRITE DOWN YOUR RESPONSES WHENEVER POSSIBLE.    - Studies indicate that food satisfaction tends to be higher when a meal includes at least 3 distinct components.  Remember, we want every meal to look like, taste like, and feel like a real meal.    Also think about:  You want to avoid living in the world of all or none.  Pretty much all good health behaviors and outcomes lie in the middle ground between all or none.   _____________________________________________________________________________  Your words of the day: Perseverate Hormesis

## 2016-12-11 ENCOUNTER — Ambulatory Visit (INDEPENDENT_AMBULATORY_CARE_PROVIDER_SITE_OTHER): Payer: 59 | Admitting: Family Medicine

## 2016-12-11 ENCOUNTER — Encounter: Payer: Self-pay | Admitting: Family Medicine

## 2016-12-11 DIAGNOSIS — F5001 Anorexia nervosa, restricting type: Secondary | ICD-10-CM

## 2016-12-11 DIAGNOSIS — Z79891 Long term (current) use of opiate analgesic: Secondary | ICD-10-CM | POA: Diagnosis not present

## 2016-12-11 NOTE — Patient Instructions (Addendum)
Use your meals list to prepare some meals this week: Dinners 1. Chicken with salad and quinoa 2. [place] chili, cornbread, and salad 3. Lasagna with salad 4. Salmon with sauted kale in olive oil & garlic and butternut squash soup (or roasted, mashed with olive oil)  Breakfasts 1. Muffins with yogurt and fruit 2. 2 eggs, oatmeal with nuts/seeds and berries 3. Kefir smoothie with  banana, berries or peaches, flaxseed meal, chia seeds, any greens,  (protein powder optional)  - Write your shopping list:  What foods do you need to keep on hand to make most of these meals any time?    Give some thoughts to how you can use the following with some of those food thoughts:  - [place] Katie's Qs  - The Urge process  At follow-up we will discuss managing negative food thoughts and anxiety.

## 2016-12-11 NOTE — Progress Notes (Signed)
Medical Nutrition Therapy:  Appt start time: 4627 end time:  1630. PCP:   Maurice Small, MD; Sadie Haber at [place] Therapist:  Joanna Puff Psychiatrist:   Raelyn Number, MD; [email or web address]  Assessment:  Primary concerns today: anorexia nervosa, restricting type; F50.01.  Natalie Schmidt again refused a weight check.  She recorded some of her food thoughts, as asked to do at last appt.  We discussed those briefly, but spent most of today's visit talking about breakfast and dinner meals Bionca can make for herself.  This is especially challenging b/c food planning, preparation, and eating all cause her much anxiety.  It is actually significant that Natalie Schmidt is willing to work on normalizing her eating to this extent.    Progress Towards Goal(s):  In progress.   Nutritional Diagnosis:  Stable progress on NB-1.2 Harmful beliefs/attitudes about food or nutrition-related topics (use with caution) As related to eating disorder.  As evidenced by more "normal" choice of foods at meals, although still abnormal eating pattern and expressed dislike of and anxiety about food/eating.    Intervention:  Nutrition counseling.  Handouts given during visit include:  AVS  Demonstrated degree of understanding via:  Teach Back   Monitoring/Evaluation:  Dietary intake, exercise, and body weight in 1 week(s).

## 2016-12-20 ENCOUNTER — Ambulatory Visit (INDEPENDENT_AMBULATORY_CARE_PROVIDER_SITE_OTHER): Payer: 59 | Admitting: Sports Medicine

## 2016-12-20 DIAGNOSIS — M7712 Lateral epicondylitis, left elbow: Secondary | ICD-10-CM | POA: Diagnosis not present

## 2016-12-20 DIAGNOSIS — M7710 Lateral epicondylitis, unspecified elbow: Secondary | ICD-10-CM

## 2016-12-20 HISTORY — DX: Lateral epicondylitis, unspecified elbow: M77.10

## 2016-12-20 NOTE — Assessment & Plan Note (Signed)
Given HEP  Arnica gel  Aleve bid  Try this consistently for 3 mos and avoid wrist extension w lifting If not resolved in 3 mos reck and [place]

## 2016-12-20 NOTE — Progress Notes (Signed)
Chronic left elbow pain  Over 1 year of left elbow pain Started with power clean exercise When seen 03/2016 had mild swelling Calorie intake was off at that time  Still gets lateral pain RT arm now OK Tends to get pain during night  Past  Anorexia - TX by Iver Nestle  ROS Denies neck pain No radicular sxs  PE Muscular F in NAD BP 100/60   Ht 5\' 3"  (1.6 m)   Wt 118 lb (53.5 kg)   BMI 20.90 kg/m   Full ROM of left elbow No real TTP over lateral elbow Good strength on wrist and finger extension + book test No swelling

## 2016-12-28 DIAGNOSIS — Z01419 Encounter for gynecological examination (general) (routine) without abnormal findings: Secondary | ICD-10-CM | POA: Diagnosis not present

## 2017-01-01 ENCOUNTER — Ambulatory Visit: Payer: 59 | Admitting: Family Medicine

## 2017-01-01 ENCOUNTER — Ambulatory Visit (INDEPENDENT_AMBULATORY_CARE_PROVIDER_SITE_OTHER): Payer: 59 | Admitting: Family Medicine

## 2017-01-01 ENCOUNTER — Encounter: Payer: Self-pay | Admitting: Family Medicine

## 2017-01-01 DIAGNOSIS — F5001 Anorexia nervosa, restricting type: Secondary | ICD-10-CM | POA: Diagnosis not present

## 2017-01-01 NOTE — Patient Instructions (Addendum)
Issues you are dealing with:   1. Hating your body.   2. Need for structure.  3. Feeling out of control.   4. Grieving loss of your ED (best friend?).  To help deal with these issues: 1. Practice cello at least 20 min 5 X wk.   2. Determine daily schedule and goals.  Include in your goals what fulfilling achievements you will work toward.  (This could be something you can complete in one day, or it might be part of a longer-term project.)  3. Work with the Urge process at least once a day, and customize it to most benefit you as you work with the chatter (thoughts) in your head.    - Email Jeannie with an update no later than 1 week, Sept 4.

## 2017-01-01 NOTE — Progress Notes (Signed)
Medical Nutrition Therapy:  Appt start time: 1007 end time:  1630. PCP:   Maurice Small, MD; Sadie Haber at [place] Therapist:  Joanna Puff Psychiatrist:   Raelyn Number, MD; [email or web address]  Assessment:  Primary concerns today: anorexia nervosa, restricting type; F50.01.  Bayler again refused a weight check, but reported a weight of 118.5 lb from her home scale.  Guy was tearful today as she described her complicated feelings about her current body and eating behaviors.  She misses "not obsessing about what she is not eating" and is extremely frustrated about her current body size and form.   Feeling food in her stomach makes her feel sluggish and heavy, and it makes her feel out of control.  She also recognizes that her eating disorder provided a lot of structure for her that she doesn't have when eating "normally."    Gerrie calmed down as we broke down her frustrations into distinct issues/problems with which she is struggling, and talked about what could help in dealing with them.  I again challenged her to get out her cello she's not played in years, believing that Vaneta absolutely needs a challenge to focus her attention and energy in a positive way rather than being consumed by her weight and eating issues.    Recent usual exersice includes:  MWF lifting weights 1 hour; Tue & Sat 30 min run and 20 min swim; Thur  60-min insanity workout.  Also runs 10-30 min early AM most days of the week, and does ~15 min of pull-ups, sit-ups, and push-ups a few days a week.    Progress Towards Goal(s):  In progress.   Nutritional Diagnosis:  No contniued progress on NB-1.2 Harmful beliefs/attitudes about food or nutrition-related topics (use with caution) As related to eating disorder.  As evidenced by extreme duress over perceived fatness of a body with a BMI <21.    Intervention:  Nutrition counseling.  Handouts given during visit include:  AVS  Demonstrated degree of understanding via:   Teach Back   Monitoring/Evaluation:  Dietary intake, exercise, and body weight in 3 week(s).

## 2017-01-03 ENCOUNTER — Ambulatory Visit: Payer: 59 | Admitting: Family Medicine

## 2017-01-22 ENCOUNTER — Ambulatory Visit (INDEPENDENT_AMBULATORY_CARE_PROVIDER_SITE_OTHER): Payer: 59 | Admitting: Family Medicine

## 2017-01-22 ENCOUNTER — Encounter: Payer: Self-pay | Admitting: Family Medicine

## 2017-01-22 DIAGNOSIS — F5001 Anorexia nervosa, restricting type: Secondary | ICD-10-CM | POA: Diagnosis not present

## 2017-01-22 NOTE — Progress Notes (Signed)
Medical Nutrition Therapy:  Appt start time: 5631 end time:  1630. PCP:   Maurice Small, MD; [facility] at [place] Therapist:  Joanna Puff Psychiatrist:   Raelyn Number, MD; [email or web address]  Assessment:  Primary concerns today: anorexia nervosa, restricting type; F50.01.  Natalie Schmidt again refused a weight check.  She realizes she has historically struggled with the amount of food to eat - not too much, not too little.  She can't seem to find the middle ground.  This is emotionally draining for Natalie Schmidt.  She feels very uncomfortable if she eats too much, further reminding her she is "fat," and doesn't want to eat.  Natalie Schmidt has concluded she needs more structure to her diet, which limits the number of decisions that need to be made, and she feels she needs to limit carb's, to help stabilize her BG and mood.    Natalie Schmidt did get her cello out; played only 3 times, but the experience was "better than expected."  She has noticed that she does feel better when she does an activity where she is focused and "lost in time."  This tends to disallow the dysfunctional thoughts about food and weight that otherwise overwhelm her.   Sleep is much improved, although she naps at least 30 min and up to 2 hr in the afternoon about 3 X wk.   Natalie Schmidt did make a schedule for her days of the week, to include work times, exercise, parenting, and meal times.   4:15 AM - Up  6 AM - small bite to eat (fruit, almonds, carrots) 6:15 AM - (M-F) Drive kids to [employer] class 6:30 - Exercise (cardio) 7:30 - Drive kids to school 8 AM - (MWF) Weights (gym at [employer]) 9 AM - Home 9:30 - Bkfst, 12:30- Lunch, 4 PM- Dinner, snack- 6:30/7 PM; bedtime 8:15 PM  Recent usual exersice includes:  MWF lifting weights 1 hour; 45 min cardio 5 X wk; and 45-min run on Saturdays.    Progress Towards Goal(s):  In progress.   Nutritional Diagnosis:  Slight progress on NB-1.2 Harmful beliefs/attitudes about food or nutrition-related  topics (use with caution) As related to eating disorder.  As evidenced by patient's expressed desire to be rid of disordered eating thinking.    Intervention:  Nutrition counseling.  Handouts given during visit include:  AVS  Demonstrated degree of understanding via:  Teach Back   Monitoring/Evaluation:  Dietary intake, exercise, and body weight in 6 week(s).

## 2017-01-22 NOTE — Patient Instructions (Addendum)
-   Rehab exercises:  Be consistent with these!! - Find the book you were looking for about overcoming depression.   - See Anola Gurney website, [email or web address].pl  1. Find a food you do enjoy, and truly experience eating it.    - Write about it, and use the Urge process if necessary.    - Email Jeannie after.   2. Work on getting more focused time (in place of default mode).   - Default time is sometimes called "narrative circuitry" b/c in this mode we are often telling ourselves an autobiographical narrative (usually negative).  - Continue to work on a list of activities you can do for this focused thinking mode.   3. Complete your weekly and daily schedule with more detail: Build in cello time.    Unk Lightning goal: At least 30 min 4 X wk.   4. Food goal: Continue to eat 3 real meals and a couple of snacks daily.

## 2017-03-13 DIAGNOSIS — M2560 Stiffness of unspecified joint, not elsewhere classified: Secondary | ICD-10-CM | POA: Diagnosis not present

## 2017-03-13 DIAGNOSIS — M2450 Contracture, unspecified joint: Secondary | ICD-10-CM | POA: Diagnosis not present

## 2017-03-13 DIAGNOSIS — M5083 Other cervical disc disorders, cervicothoracic region: Secondary | ICD-10-CM | POA: Diagnosis not present

## 2017-03-15 DIAGNOSIS — M5083 Other cervical disc disorders, cervicothoracic region: Secondary | ICD-10-CM | POA: Diagnosis not present

## 2017-03-15 DIAGNOSIS — M2560 Stiffness of unspecified joint, not elsewhere classified: Secondary | ICD-10-CM | POA: Diagnosis not present

## 2017-03-15 DIAGNOSIS — M2450 Contracture, unspecified joint: Secondary | ICD-10-CM | POA: Diagnosis not present

## 2017-03-19 ENCOUNTER — Ambulatory Visit (INDEPENDENT_AMBULATORY_CARE_PROVIDER_SITE_OTHER): Payer: 59 | Admitting: Family Medicine

## 2017-03-19 DIAGNOSIS — F509 Eating disorder, unspecified: Secondary | ICD-10-CM

## 2017-03-19 NOTE — Patient Instructions (Addendum)
-   Important to recognize: Whatever your weight has been, you have been unhappy with it.    -Your real outcome goals:  - To feel good as you go through each day.   - To feel good about the choices you make.   - To feel in control of your appetite and choices.    1. Recommended schedule of eating and sleeping:  - Sleep:  To bed NO LATER than 9 PM; up NO EARLIER than 5 AM.   - Meals:  Eat at least 3 REAL meals and 1-2 snacks per day.  Aim for no more than 5 hours between eating.  Eat breakfast within one hour of getting up.  2. Minimize sweet foods.  (Do an Quarry manager, then email what you find to [facility].) 3. Make a list of meals using the form previously provided.    A REAL meal includes at least some protein, some starch, and vegetables and/or fruit.

## 2017-03-19 NOTE — Progress Notes (Signed)
Medical Nutrition Therapy:  Appt start time: 1740 end time:  1630. PCP:   Maurice Small, MD; [facility] at [place] Therapist:  Joanna Puff Psychiatrist:   Raelyn Number, MD; [email or web address]  Assessment:  Primary concerns today: anorexia nervosa, restricting type; F50.01.  Natalie Schmidt again refused a weight check.  She was gone for 4-5 weeks, at their home in [place], followed by visiting family in [place].  She was by herself in [facility], working hard every day building a 165-foot-long fence around their house.  Since going to [place], Natalie Schmidt's depression has worsened, possibly triggered by seeing both of her sisters, who are very thin (one 30 yrs younger, one 2 yrs older).    When in [place] cooked real meals for herself, and felt like she was living in a pretty balanced way, which has deteriorated since being home (starting with visit to family in [place]).  Natalie Schmidt remains especially distressed over both her body weight/shape, and what feels like an inability to resist eating sweets (see recall below).  We talked about the potential effects of hyper-palatable foods on appetite, and also discussed the idea of working on shifting focus from body weight to how she feels each day (see Pt Instrxns).    24-hr recall:  (Up at 6 AM) B (8:30 AM)-  2 homemade muffins, 2 tsp pnut butter, hot choc mix in tea (half pro pdr, half cocoa, some powdered milk)  Snk ( AM)-  3 muffins, 1 slc lemon cake, chx wrap on corn tortilla, hot choc mix in tea L (12 PM)-  --- Snk (3 PM)-  1 corn tortilla, 2 oz beef, lettuce, ____?,  D (6 PM)-  1 beef patty, cheese, 2-3 c roasted onion & asparagus, 1/3 c ice cream, "a lot" of homemade choc sauce (1 part butter: 1 sugar: 1 cocoa:  3 milk) Snk ( PM)-  --- Typical day? Yes.  Although some days meals and snacks are more erratic.  This AM, Natalie Schmidt awoke at 3 AM, and had hot choc mix in tea, tortilla chips and hummus, followed by two muffins.  At 7 AM she ate 2 scrambled eggs w/ 2 toast,  followed by 2 squares of fudge and some cake.    Recent usual exersice includes:  Not documented.     Progress Towards Goal(s):  In progress.   Nutritional Diagnosis:  No further progress on NB-1.2 Harmful beliefs/attitudes about food or nutrition-related topics (use with caution) As related to eating disorder.  As evidenced by pt's extreme discomfort with current body weight and eating behaviors.    Intervention:  Nutrition counseling.  Handouts given during visit include:  AVS  Demonstrated degree of understanding via:  Teach Back   Monitoring/Evaluation:  Dietary intake, exercise, and body weight in 3 week(s).

## 2017-04-11 ENCOUNTER — Ambulatory Visit (INDEPENDENT_AMBULATORY_CARE_PROVIDER_SITE_OTHER): Payer: 59 | Admitting: Family Medicine

## 2017-04-11 DIAGNOSIS — F5001 Anorexia nervosa, restricting type: Secondary | ICD-10-CM

## 2017-04-11 NOTE — Patient Instructions (Addendum)
From last visit: -Your real outcome goals:             - To feel good as you go through each day.              - To feel good about the choices you make.              - To feel in control of your appetite and choices.   - Your belief: Self-criticism motivates me to make better choices.  How true is this?  Is it 100% true?    - Meals/recipes:  A simple meal is a salad made with pre-washed mixed greens and a few cut-up veg's with some add-ons as well as a protein component.   Add-ons:  Water chestnuts, cheese (goat/cottage/hard/creamy), yogurt, avocado, beans, nuts, dried or fresh fruit, dressing. Protein: chicken, fish, any meat, beans, Greek yogurt, soy products, egg. Carb can be added (quinoa or other grain), or it can be on the side (crackers, any type of bread/roll/muffin).   Note:  A meal is usually most satisfying if it includes at least 3 separate components.    - You can do a similar meal with soup.

## 2017-04-11 NOTE — Progress Notes (Signed)
Medical Nutrition Therapy:  Appt start time: 9798 end time:  1630. PCP:   Maurice Small, MD; [facility] at [place] Therapist:  Joanna Puff Psychiatrist:   Raelyn Number, MD; [email or web address]  Assessment:  Primary concerns today: anorexia nervosa, restricting type; F50.01.  Natalie Schmidt again refused a weight check.  She has been trying to cut down on the sugar on her diet.  She has been eating more fermented foods  Including kefir, saurkraut, kombucha, cheese, yogurt, and raw apple cider vinegar.  She is drinking more water, combining sweet foods with something healthy, and choosing a small quantity of a high-quality sweet vs. more of a lower quality food.  [name] remains dissatisfied with the degree to which she feels she has control over her eating.    She has not been successful in creating meals she likes, or even coming up with meal ideas.  Depression seems to be a barrier to following through with this.  In addn, she feels very guilty whenever she eats.  We talked at length about this bad feeling related to having food in her stomach, inability to "take it in."  Experimented with a "tether" exercise, each of [place] holding one end of a glove.  [name] immediately felt anxious, but settled into the exercise, and even started to like the feeling of pulling on the glove as she associated this with feeling connected.  Per polyvagal theory and somatic experiencing, this may be suggestive of deficit in the "Grasp/Pull" movement, associated with Self-reliant character strategy.    Also discussed using the Urge process to help lessen the "chatter" of negative thoughts and self-criticism.  [name]'s music (piano or cello) is usually effective in this.    Recent usual exersice includes:  Not documented.     Progress Towards Goal(s):  In progress.   Nutritional Diagnosis:  No further progress on NB-1.2 Harmful beliefs/attitudes about food or nutrition-related topics (use with caution) As related to eating  disorder.  As evidenced by pt's extreme discomfort with current body weight and eating behaviors.    Intervention:  Nutrition counseling.  Handouts given during visit include:  AVS  Demonstrated degree of understanding via:  Teach Back   Monitoring/Evaluation:  Dietary intake, exercise, and body weight in 4 week(s).

## 2017-04-23 ENCOUNTER — Ambulatory Visit: Payer: 59 | Admitting: Family Medicine

## 2017-05-13 ENCOUNTER — Ambulatory Visit: Payer: 59 | Admitting: Family Medicine

## 2017-06-13 ENCOUNTER — Encounter: Payer: Self-pay | Admitting: Family Medicine

## 2017-06-13 ENCOUNTER — Ambulatory Visit (INDEPENDENT_AMBULATORY_CARE_PROVIDER_SITE_OTHER): Payer: 59 | Admitting: Family Medicine

## 2017-06-13 DIAGNOSIS — F5001 Anorexia nervosa, restricting type: Secondary | ICD-10-CM | POA: Diagnosis not present

## 2017-06-13 NOTE — Progress Notes (Signed)
Medical Nutrition Therapy:  Appt start time: 5409 end time:  1630. PCP:   Maurice Small, MD; Sadie [facility]er at [place] Therapist:  Joanna Puff Psychiatrist:   Raelyn Number, MD; [email or web address]  Assessment:  Primary concerns today: anorexia nervosa, restricting type; F50.01.  Natalie Schmidt again refused a weight check.  She feels she is doing better in terms of mood.  Eating has been relatively stable, but she is still overly focused on her body fat, which represents a daily battle for her even though she has trained herself to eat with regularity.  [name] is also eating meals that are more "normal" in composition.  Still feels that overeating and eating sweets are problematic in the evenings.  Another problem is that [name] has only three meals she always uses:  Chili with 1 corn tortilla, quiche, salad with chx.    [name] at dinner last night with her family, which she does not usually do.  [name] was distressed at her after-dinner snacking, and asked why she is so driven to eat these sweet foods.  Although I think she intended this as a real question, she launched into her own answer as she thought about the conflicts and stress experienced around the family dinner table.    24-hr recall:  (Up at 6 AM) B (8:30 AM)-  1/2 c kefir, 1-2 t chia, 1-2 T ground almonds, ~1/2 Tflax meal, ~1/2 tsp psyllium, protein powder (~12 g pro), 1 low-carb hi-pro muffin, 2 t peanut butter, ginger tea w/ 3 T pro powder (12 g pro), 3 T cocoa Snk ( AM)-   L (2 PM)-  3 c greens, 3 1/2 egg whites, 1/8 quiche, ginger tea w/ 3 T pro powder (8 g pro), 2 T cocoa, 1 cc cookie  Snk ( PM)-   D (6 PM)-  1 small pc pepperoni pizza, salad, 3 oz pork, 1 oz chs, 1 1/2 egg whites, yogurt drsng (50 kcal/3T)  Snk (6:30)-  1/2 muffin, 1 t peanut butter, 1 Dove chocolate, 3 bites ice crm, 1 cc cookie  Typical day? Yes.   Felt guilty about eating pizza; usually eats more at bkfst.   Recent usual exersice includes:  Not documented.      Progress Towards Goal(s):  In progress.   Nutritional Diagnosis:  No further progress on NB-1.2 Harmful beliefs/attitudes about food or nutrition-related topics (use with caution) As related to eating disorder.  As evidenced by pt's extreme discomfort with current body weight and eating behaviors.    Intervention:  Nutrition counseling.  Handouts given during visit include:  AVS  Demonstrated degree of understanding via:  Teach Back   Monitoring/Evaluation:  Dietary intake, exercise, and body weight in 4 week(s).

## 2017-06-13 NOTE — Patient Instructions (Addendum)
-   Dealing with distressing thoughts and emotions around eating times:  1. Eat dinner early (5 PM), and avoid (for now) eating with the family.    2. Do most of kitchen clean-up before everyone has finished eating.    3. Then: Engage in a focused, fulfilling activity, such as reading a good book, study for a class; explore pre-reqs for med school; sewing (t-shirt quilt); tidy room; jog around neighborhood; play piano; Zada Girt, talk to someone you like.    - If you find yourself in that negative thinking/feeling, use the Urge process.    - Email Jeannie at least 2 more meals.  Review the [employer].

## 2017-07-04 ENCOUNTER — Encounter: Payer: Self-pay | Admitting: Family Medicine

## 2017-07-04 ENCOUNTER — Ambulatory Visit (INDEPENDENT_AMBULATORY_CARE_PROVIDER_SITE_OTHER): Payer: 59 | Admitting: Family Medicine

## 2017-07-04 DIAGNOSIS — F5001 Anorexia nervosa, restricting type: Secondary | ICD-10-CM

## 2017-07-04 NOTE — Progress Notes (Signed)
Medical Nutrition Therapy:  Appt start time: 1330 end time:  1430. PCP:   Maurice Small, MD; Natalie Schmidt at [place] Therapist:  Joanna Schmidt Psychiatrist:   Raelyn Number, MD; [email or web address]  Assessment:  Primary concerns today: anorexia nervosa, restricting type; F50.01.  Natalie Schmidt again refused a weight check.  She has been practicing the strategies we discussed last time around her dinner meal.  She has not, however, managed to do any of the focus-shifting activities we talked about.  Tends to just go to bed soon after dinner.    [name] has really been struggling with food choices when she doesn't have access to her safe foods.  She obsesses about her body size, shape, and weight every time she eats.  She recognizes that her erratic eating is problematic for her, decreasing both her sense of control and nutritional adequacy of her meals or snacks.     [name]'s life is completely chaotic right now, mostly related to her son's severe mental health problems.    Recent usual exersice includes:  Not documented.     Progress Towards Goal(s):  In progress.     Nutritional Diagnosis:  No further progress on NB-1.2 Harmful beliefs/attitudes about food or nutrition-related topics (use with caution) As related to eating disorder.  As evidenced by pt's extreme discomfort with current body weight and eating behaviors.    Intervention:  Nutrition counseling.  Handouts given during visit include:  AVS  Demonstrated degree of understanding via:  Teach Back   Monitoring/Evaluation:  Dietary intake, exercise, and body weight in 4 week(s).

## 2017-07-04 NOTE — Patient Instructions (Signed)
-   Stability of self-care is crucial to being able to manage all the difficulties encountered in your life.    - When we don't practice self-kindness, we are much more likely to practice self-indulgence.    Preventing self-indulgent eating: - Advance planning; being prepared with foods and ingredients I like.   - Plan at least a skeletal plan for meals and snacks (times and what) first thing in the morning.   - Weekly planning to include shopping/food prep (with meal planning form?)    This also includes making muffins or other foods you want to keep on hand.   - Getting adequate sleep.   - Consistent bedtimes and getting up times.    - Use Aiken glasses to help with sleep.  - Eating with regularity (to keep blood sugar levels even).  - Consistent exercise.  - Appropriate self-talk that includes self-compassion, self-kindness, and self-understanding.   - Mindful approaches to negative feelings.    - Consideration of the bigger perspective, remembering my whole history as well as a biological basis for disordered eating.    - Use at least one tool you have learned, e.g., Urge process or meditation or deep breathing or Decoding Qs (How do I feel; how do I want to feel; what do I truly need?), Byron Katie's 4 Qs (Is it true; can I be absolutely sure it's true; how do I react when I believe it; who would I be without this thought?). Which tool am I going to use this week?

## 2017-08-01 ENCOUNTER — Ambulatory Visit (INDEPENDENT_AMBULATORY_CARE_PROVIDER_SITE_OTHER): Payer: 59 | Admitting: Family Medicine

## 2017-08-01 DIAGNOSIS — F5001 Anorexia nervosa, restricting type: Secondary | ICD-10-CM

## 2017-08-01 NOTE — Progress Notes (Signed)
Medical Nutrition Therapy:  Appt start time: 1400 end time:  1500. PCP:   Maurice Small, MD; Sadie Haber at [place] Therapist:  Joanna Puff Psychiatrist:   Raelyn Number, MD; [email or web address]  Assessment:  Primary concerns today: anorexia nervosa, restricting type; F50.01.  Chad again refused a weight check.  She has done many of last visit's recommendations.  Exercise has been consistent, and she has been practicing both piano and cello most days after breakfast.  She has also been getting better and more sleep, maintaining a consistent early bedtime.  She has made a point of telling others not to try to contact her after 8 PM.  She has also been planning her meals better on a daily basis.  Significantly, Megan has made a point of eating foods she really likes, and she is eating with regularity during the day.  She is still frustrated with overeating at night and eating high-sugar foods, which she ascribes to feeling stressed.    Leydi has not used the Urge process, nor has she practiced any of the self-compassion activities previously discussed.  She still talks about her own body as being "fat," although I have the impression that this perception is not quite as toxic to [place] as it used to be.    Recent usual exersice includes:  40-80 min of some kind of home workout or running 6 X wk.  She adds another 30-min workout about twice a week.    Progress Towards Goal(s):  In progress.     Nutritional Diagnosis:  No further progress on NB-1.2 Harmful beliefs/attitudes about food or nutrition-related topics (use with caution) As related to eating disorder.  As evidenced by pt's extreme discomfort with current body weight and eating behaviors.    Intervention:  Nutrition counseling.  Handouts given during visit include:  AVS  Primer on Polyvagal Theory  Demonstrated degree of understanding via:  Teach Back   Monitoring/Evaluation:  Dietary intake, exercise, and body weight in 4  week(s).

## 2017-08-01 NOTE — Patient Instructions (Addendum)
-   Evening re-set time:  8-minute loop jog or 8-min walk.      - Where am I on the ladder?  - What can I identify as triggers to moving down the ladder?  - When have I experienced glimmers of being at the top of the ladder (balanced & engaged), and what do those glimmers look like & feel like?  - The goals are to eventually recognize when you are starting to move down the ladder and what usually works to help you move up to the top of the ladder (e.g., piano, cello, run/walk)?  - Congratulations on doing well with your eating and emotions regulation.    - Email Jeannie within a couple weeks to provide a DETAILED update.

## 2017-08-26 ENCOUNTER — Ambulatory Visit: Payer: 59 | Admitting: Family Medicine

## 2017-09-03 ENCOUNTER — Ambulatory Visit (INDEPENDENT_AMBULATORY_CARE_PROVIDER_SITE_OTHER): Payer: 59 | Admitting: Family Medicine

## 2017-09-03 DIAGNOSIS — F5001 Anorexia nervosa, restricting type: Secondary | ICD-10-CM

## 2017-09-03 NOTE — Patient Instructions (Addendum)
-   If you are convinced that you need a break from sugar, the only way to be successful in this will be to make sure you eat ENOUGH carb other than sugar AND to be consistent in your food intake over the day.  You want to avoid getting over-hungry.    This will also mean getting a balanced proportion of carb and protein at each meal.    Also recommended (which should help with appetite management): twice the volume of veg's as either protein or starch.    - Meal composition (content) and timing both greatly influence appetite (mediated through satiety).   - Make a list of vegetable recipes you're interested in trying.  Suggestion: roasting or sauteeing and soups.   - Use your list of home-assembled meals to meet your goals (below).    Goals: 1. 3 meals and one snack/day.  2. At least 2 meals include twice the volume of veg's as protein or starch foods.  No limit to veg intake.   3. Lean toward high-fiber starch sources, but an occasional white-flour food is actually ok.  Remember that the glycemic index of that food can be lowered by addn of fiber (and fat and protein) from other foods.    - Remember that satiety at each meal is important to overall appetite control.

## 2017-09-03 NOTE — Progress Notes (Signed)
Medical Nutrition Therapy:  Appt start time: 1400 end time:  1500. PCP:   Maurice Small, MD; Sadie Haber at [place] Therapist:  Joanna Puff Psychiatrist:   Raelyn Number, MD; [email or web address]  Assessment:  Primary concerns today: anorexia nervosa, restricting type; F50.01.  Natalie Schmidt again refused a weight check, although she has been checking weight at home occasionally.  Food has become even more emotionally charged for her as she has gained a bit of weight (did not discuss actual weight).  She is especially bothered by her sugar intake as well as eating when she is not hungry.    Natalie Schmidt did not send an email update since last appt, but did work on tracking where she is on the "autonomic ladder."  She has realized she is better at figuring out where others are on the ladder than where she is.  Since Natalie Schmidt's last appt, she feels most of her eating has become more chaotic and stress-driven.  She has not been able to initiate the evening re-set time as discussed at last appt.  Her schedule has been crazy; her son has hospitalized for [facility] wks at [facility]; her husband's work schedule precludes his participating in dinner, so she has been sitting with her kids during what is a stressful time for her; her sister visited for a week with a newborn baby; and she spent a few days hiking the [place] with one son and his boyscout troop.    Recent usual exersice includes:  40-80 min of some kind of home workout or running 6 X wk.  She adds another 30-min workout about twice a week.    Progress Towards Goal(s):  In progress.     Nutritional Diagnosis:  No further progress on NB-1.2 Harmful beliefs/attitudes about food or nutrition-related topics (use with caution) As related to eating disorder.  As evidenced by pt's extreme discomfort with current body weight and eating behaviors.    Intervention:  Nutrition counseling.  Handouts given during visit include:  AVS  Monitoring/Evaluation:  Dietary intake,  exercise, and body weight in 5 week(s).

## 2017-10-10 ENCOUNTER — Ambulatory Visit: Payer: 59 | Admitting: Family Medicine

## 2017-11-05 DIAGNOSIS — Z3169 Encounter for other general counseling and advice on procreation: Secondary | ICD-10-CM | POA: Diagnosis not present

## 2017-11-05 DIAGNOSIS — Z1329 Encounter for screening for other suspected endocrine disorder: Secondary | ICD-10-CM | POA: Diagnosis not present

## 2017-11-05 DIAGNOSIS — E288 Other ovarian dysfunction: Secondary | ICD-10-CM | POA: Diagnosis not present

## 2017-11-08 DIAGNOSIS — M546 Pain in thoracic spine: Secondary | ICD-10-CM | POA: Diagnosis not present

## 2017-11-08 DIAGNOSIS — M542 Cervicalgia: Secondary | ICD-10-CM | POA: Diagnosis not present

## 2017-11-08 DIAGNOSIS — M5083 Other cervical disc disorders, cervicothoracic region: Secondary | ICD-10-CM | POA: Diagnosis not present

## 2017-11-11 DIAGNOSIS — M5083 Other cervical disc disorders, cervicothoracic region: Secondary | ICD-10-CM | POA: Diagnosis not present

## 2017-11-11 DIAGNOSIS — M542 Cervicalgia: Secondary | ICD-10-CM | POA: Diagnosis not present

## 2017-11-11 DIAGNOSIS — M546 Pain in thoracic spine: Secondary | ICD-10-CM | POA: Diagnosis not present

## 2017-11-13 DIAGNOSIS — Z1329 Encounter for screening for other suspected endocrine disorder: Secondary | ICD-10-CM | POA: Diagnosis not present

## 2017-11-13 DIAGNOSIS — M542 Cervicalgia: Secondary | ICD-10-CM | POA: Diagnosis not present

## 2017-11-13 DIAGNOSIS — M5083 Other cervical disc disorders, cervicothoracic region: Secondary | ICD-10-CM | POA: Diagnosis not present

## 2017-11-13 DIAGNOSIS — M546 Pain in thoracic spine: Secondary | ICD-10-CM | POA: Diagnosis not present

## 2017-11-14 DIAGNOSIS — N944 Primary dysmenorrhea: Secondary | ICD-10-CM | POA: Diagnosis not present

## 2018-02-10 ENCOUNTER — Ambulatory Visit (INDEPENDENT_AMBULATORY_CARE_PROVIDER_SITE_OTHER): Payer: 59 | Admitting: Family Medicine

## 2018-02-10 ENCOUNTER — Encounter: Payer: Self-pay | Admitting: Family Medicine

## 2018-02-10 DIAGNOSIS — F5001 Anorexia nervosa, restricting type: Secondary | ICD-10-CM

## 2018-02-10 DIAGNOSIS — F50019 Anorexia nervosa, restricting type, unspecified: Secondary | ICD-10-CM

## 2018-02-10 NOTE — Progress Notes (Signed)
Medical Nutrition Therapy:  Appt start time: 1400 end time:  1500. PCP:   Maurice Small, MD; [facility] [place] Therapist:  Joanna Puff Psychiatrist:   Raelyn Number, MD; [email or web address]  Assessment:  Primary concerns today: anorexia nervosa, restricting type; F50.01.  Natalie Schmidt again refused a weight check, although she has been checking weight at home occasionally; currently 117 lb, which Zury considers over-fat.  Life has been very stressful in the past few months.  Her 17-YO son continues to suffer severe depression, but is back in school, and managed to make up over the summer classes he failed last year.  Nyela was especially depressed after she stopped taking all her medications ~mid-August.  She has experienced no mania off of her med's, and her depression has gotten slowly better - to what she considers a tolerable level.  She is doing as many non-medical things as she can for depression, which include consistent sleep schedule; cardio ex first thing in the day at least 5 X wk; taking fish oil daily; 1 hour light therapy during non-summer months; and green juice (from fresh veg's) daily.    Cortlyn has not played her cello in weeks, and when she considers what else she can do to help with depression, she realizes she has not been connecting with others or getting outside in nature as often as would be good to do.    Recent usual exercise:  Insanity workout 3 X wk; running 1-2 X wk; 100 pull-ups every other day (sets of 5 through the day).  Exercise has become onerous to [place], which is a new development for someone who always loved exercise.  She seems not to connect this with her recent depression.  Jeannie still insists that her larger body size and added fat makes it difficult for her to enjoy exercise as she used to.   Sleep:  Estimates she gets 8 hrs per night; goes to bed ~8 PM, up at 4 AM.   Usual eating pattern:  3 meals and 1-2 snacks.    24-hr recall was not done; instead,  [facility] provided a composite day (Most days bkfst and lunch are the same):  (Up at 5:30 AM) 6:30 AM - exercise B (8:15 AM)-  2 homemade muffins, 2-3 T but butter, ~3 c tea with cocoa & pro powder,  Snk (10:15)-  1/3 c whole milk, 1/2 c cot chs, 1 scoop pro powder (25 g), 1 T each of chia, sunflwr, flx sds, 1-2 muffins or 45- bites of salmon L (12:30 PM)-  3-4 oz animal protein, leafy greens, cheese, avocado, dressing Snk ( PM)-  Green juice (14-16 oz), 1 tbsp homemade choc (honey, cocoa pwdr, and butter/coconut oil), tea D (5 PM)-  Grazes on kids' dinner (e.g., falafel, veg's, 1/2-1 avocado, apple cobbler) Snk ( PM)-  often continues eating even when not hungry Typical day? No.  Days are erratic, and seldom the same after mid-day.    Nutritional Diagnosis:  No further progress on NB-1.2 Harmful beliefs/attitudes about food or nutrition-related topics (use with caution) As related to eating disorder.  As evidenced by pt's extreme discomfort with current body weight and eating behaviors.    Intervention:  Nutrition counseling.  Handouts given during visit include:  AVS  Monitoring/Evaluation:  Dietary intake, exercise, and body weight in 4 week(s).

## 2018-02-10 NOTE — Patient Instructions (Addendum)
-   Make a list of salads and soups you might like for dinner, e.g., beet&arugula salad.    - Low blood sugar is a killer of willpower.  (See researcher Erlene Quan.)  Food-related goals:  - Breakfast no later than 8:15; morning snack, lunch, +/- aft snack, dinner no later than 4:30, with green drink following.    Other things you can do for managing depression:  - Get outside more often (goal of at least X wk)/ - Connect with someone (friend or family) at least once a day, including in person at least once a week.   - Promise me you will play the cello for at least 15 minutes 3 times a week.   - Review the Urge Process, and use as you are able.  (This is especially relevant to your body image, and the role that body image plays in your difficulty in exercising recently.)  - Watch the DVD Embraced when you are able to, and be prepared to discuss at follow-up.    Accountability: - Email Jeannie no later than 10/16 to let me know how you're doing.

## 2018-03-11 ENCOUNTER — Ambulatory Visit: Payer: 59 | Admitting: Family Medicine

## 2018-03-24 DIAGNOSIS — N840 Polyp of corpus uteri: Secondary | ICD-10-CM | POA: Diagnosis not present

## 2018-03-24 DIAGNOSIS — D259 Leiomyoma of uterus, unspecified: Secondary | ICD-10-CM | POA: Diagnosis not present

## 2018-06-17 DIAGNOSIS — H8192 Unspecified disorder of vestibular function, left ear: Secondary | ICD-10-CM | POA: Diagnosis not present

## 2018-07-07 DIAGNOSIS — B029 Zoster without complications: Secondary | ICD-10-CM | POA: Diagnosis not present

## 2018-07-31 DIAGNOSIS — Z7689 Persons encountering health services in other specified circumstances: Secondary | ICD-10-CM | POA: Diagnosis not present

## 2018-07-31 DIAGNOSIS — N979 Female infertility, unspecified: Secondary | ICD-10-CM | POA: Diagnosis not present

## 2018-08-07 DIAGNOSIS — Z1329 Encounter for screening for other suspected endocrine disorder: Secondary | ICD-10-CM | POA: Diagnosis not present

## 2018-08-11 DIAGNOSIS — N979 Female infertility, unspecified: Secondary | ICD-10-CM | POA: Diagnosis not present

## 2018-08-15 DIAGNOSIS — Z331 Pregnant state, incidental: Secondary | ICD-10-CM | POA: Diagnosis not present

## 2018-08-15 DIAGNOSIS — Z7689 Persons encountering health services in other specified circumstances: Secondary | ICD-10-CM | POA: Diagnosis not present

## 2018-09-05 DIAGNOSIS — Z7689 Persons encountering health services in other specified circumstances: Secondary | ICD-10-CM | POA: Diagnosis not present

## 2018-09-12 DIAGNOSIS — Z3200 Encounter for pregnancy test, result unknown: Secondary | ICD-10-CM | POA: Diagnosis not present

## 2018-09-30 ENCOUNTER — Encounter (HOSPITAL_COMMUNITY): Payer: Self-pay

## 2018-09-30 DIAGNOSIS — N911 Secondary amenorrhea: Secondary | ICD-10-CM | POA: Diagnosis not present

## 2018-12-29 ENCOUNTER — Other Ambulatory Visit (HOSPITAL_COMMUNITY): Payer: Self-pay

## 2018-12-29 DIAGNOSIS — Z363 Encounter for antenatal screening for malformations: Secondary | ICD-10-CM

## 2018-12-29 DIAGNOSIS — Z3A20 20 weeks gestation of pregnancy: Secondary | ICD-10-CM

## 2018-12-29 DIAGNOSIS — O09522 Supervision of elderly multigravida, second trimester: Secondary | ICD-10-CM

## 2018-12-30 ENCOUNTER — Ambulatory Visit (HOSPITAL_COMMUNITY): Payer: 59

## 2018-12-30 ENCOUNTER — Ambulatory Visit (HOSPITAL_COMMUNITY)
Admission: RE | Admit: 2018-12-30 | Discharge: 2018-12-30 | Disposition: A | Discharge status: Home / Self Care | Payer: 59 | Source: Ambulatory Visit | Attending: Obstetrics and Gynecology | Admitting: Obstetrics and Gynecology

## 2018-12-30 ENCOUNTER — Encounter (HOSPITAL_COMMUNITY): Payer: Self-pay

## 2018-12-30 ENCOUNTER — Other Ambulatory Visit: Payer: Self-pay

## 2018-12-30 DIAGNOSIS — Z3A20 20 weeks gestation of pregnancy: Secondary | ICD-10-CM | POA: Insufficient documentation

## 2018-12-30 DIAGNOSIS — O09812 Supervision of pregnancy resulting from assisted reproductive technology, second trimester: Secondary | ICD-10-CM

## 2018-12-30 DIAGNOSIS — Z363 Encounter for antenatal screening for malformations: Secondary | ICD-10-CM | POA: Insufficient documentation

## 2018-12-30 DIAGNOSIS — O09529 Supervision of elderly multigravida, unspecified trimester: Secondary | ICD-10-CM

## 2018-12-30 DIAGNOSIS — O283 Abnormal ultrasonic finding on antenatal screening of mother: Secondary | ICD-10-CM

## 2018-12-30 DIAGNOSIS — O09522 Supervision of elderly multigravida, second trimester: Secondary | ICD-10-CM | POA: Diagnosis present

## 2019-01-04 LAB — MATERNIT 21 PLUS CORE, BLOOD
Fetal Fraction: 13
Result (T21): NEGATIVE
Trisomy 13 (Patau syndrome): NEGATIVE
Trisomy 18 (Edwards syndrome): NEGATIVE
Trisomy 21 (Down syndrome): NEGATIVE

## 2019-01-05 ENCOUNTER — Telehealth (HOSPITAL_COMMUNITY): Payer: Self-pay | Admitting: Genetic Counselor

## 2019-01-05 NOTE — Telephone Encounter (Signed)
I called Ms. Natalie Schmidt to discuss her negative noninvasive prenatal screening (NIPS)/cell free DNA (cfDNA) testing result. Specifically, Ms. Natalie Schmidt  had MaterniT21 testing through [facility]. These negative results demonstrated an expected representation of chromosome 21, 18, and 13 material, greatly reducing the likelihood of trisomies 21, 13, or 18 for the pregnancy. Ms. Natalie Schmidt requested not to learn about the expected fetal sex at this time. She requested that the test result be emailed to her attached as a PDF file that she could access should she be interested in learning about the fetal sex.  NIPS analyzes placental (fetal) DNA in maternal circulation. NIPS is considered to be highly specific and sensitive, but is not considered to be a diagnostic test. We reviewed that this testing identifies the majority of pregnancies with trisomies 67, 5, and 53, as well as the sex of the baby. Diagnostic testing via amniocentesis is available should she be interested in confirming this result. She confirmed that she had no questions about these results at this time.  Buelah Manis, MS Genetic Counselor

## 2019-01-05 NOTE — Telephone Encounter (Signed)
LVM for Natalie Schmidt indicating that I was calling with good news about her NIPS results. Requested call back to discuss in more detail as there was a general voicemail message with no identifiers.

## 2019-01-09 ENCOUNTER — Other Ambulatory Visit (HOSPITAL_COMMUNITY): Payer: Self-pay | Admitting: Maternal & Fetal Medicine

## 2019-01-30 ENCOUNTER — Other Ambulatory Visit (HOSPITAL_COMMUNITY): Payer: Self-pay | Admitting: Maternal & Fetal Medicine

## 2019-05-23 DIAGNOSIS — O321XX0 Maternal care for breech presentation, not applicable or unspecified: Secondary | ICD-10-CM

## 2019-09-19 IMAGING — US US MFM OB DETAIL +14 WK
1 series · 13 of 28 positions shown · non-contrast
Comparison: none

[Series 1: us mfm ob detail +14 wk · 13 of 71 slices shown]
[im 3/71]
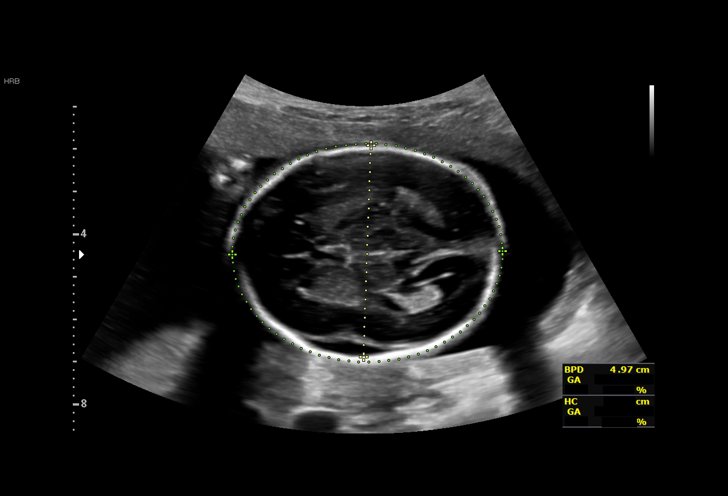
[im 8/71]
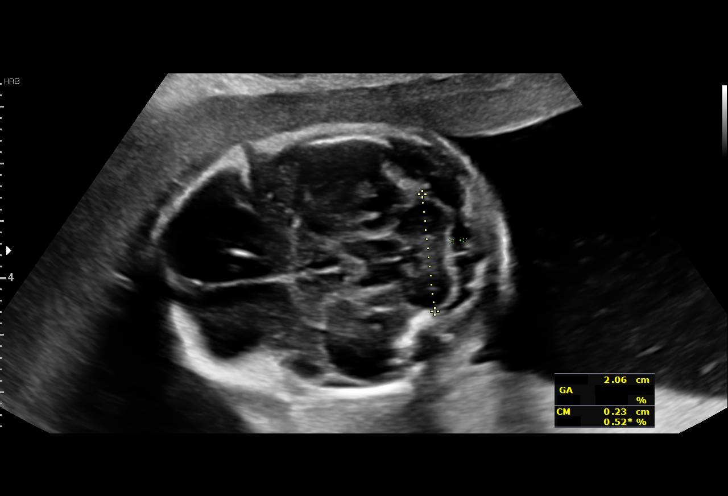
[im 13/71]
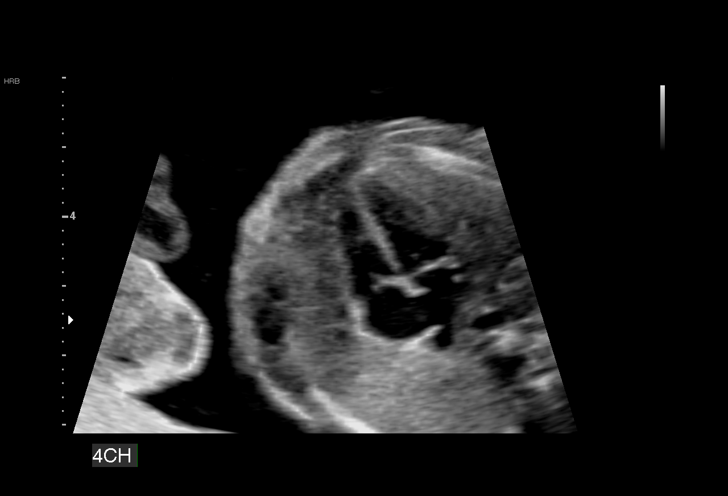
[im 19/71]
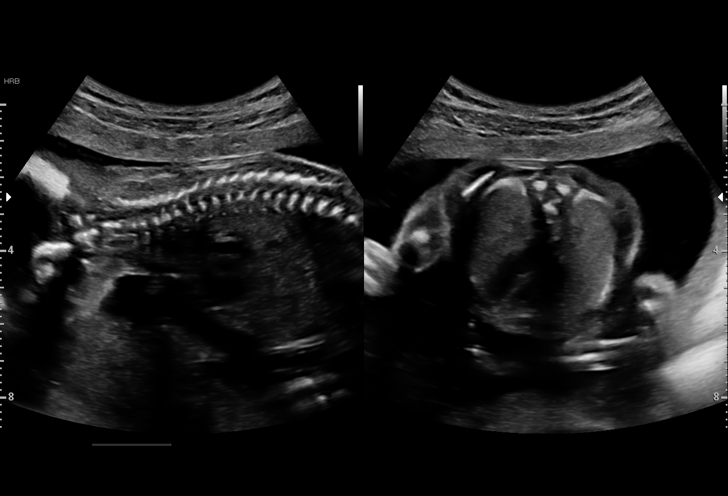
[im 24/71]
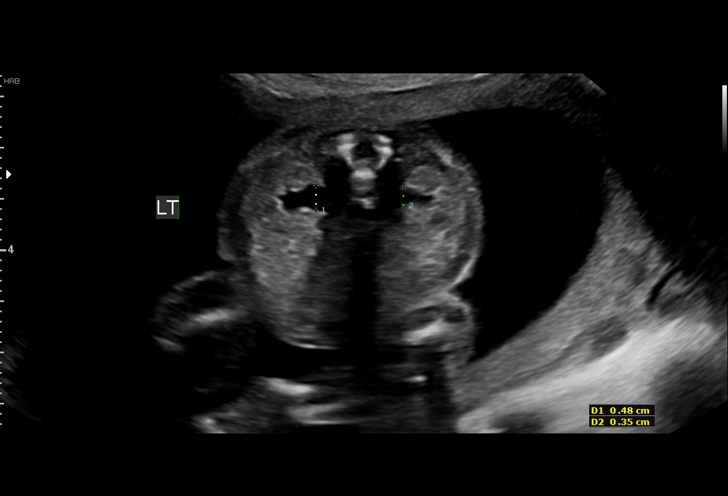
[im 29/71]
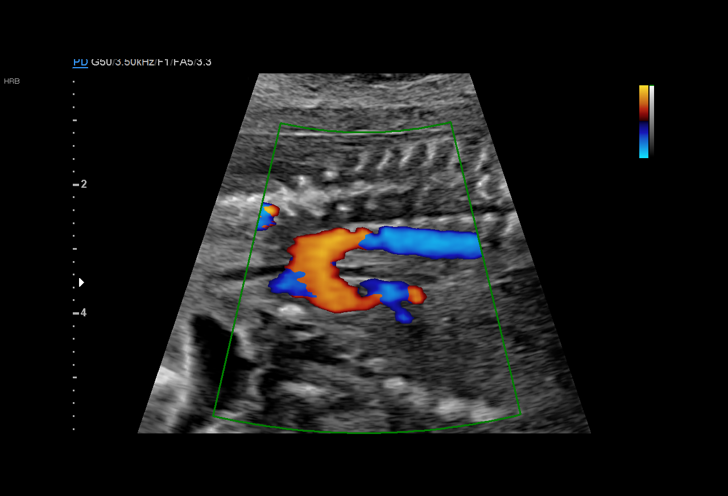
[im 37/71]
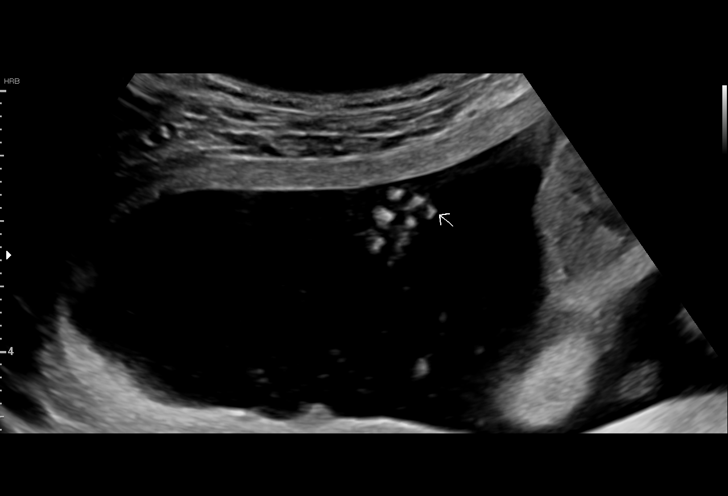
[im 42/71]
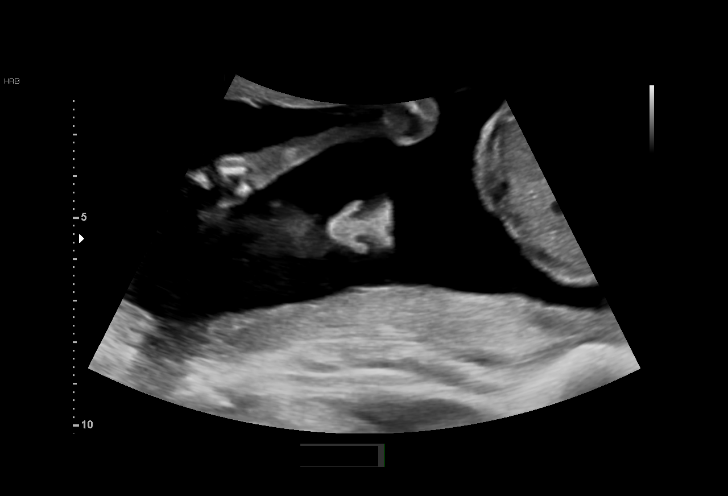
[im 47/71]
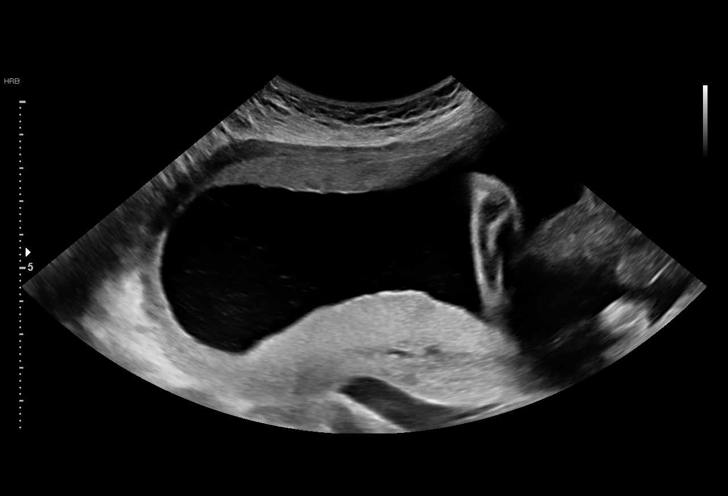
[im 52/71]
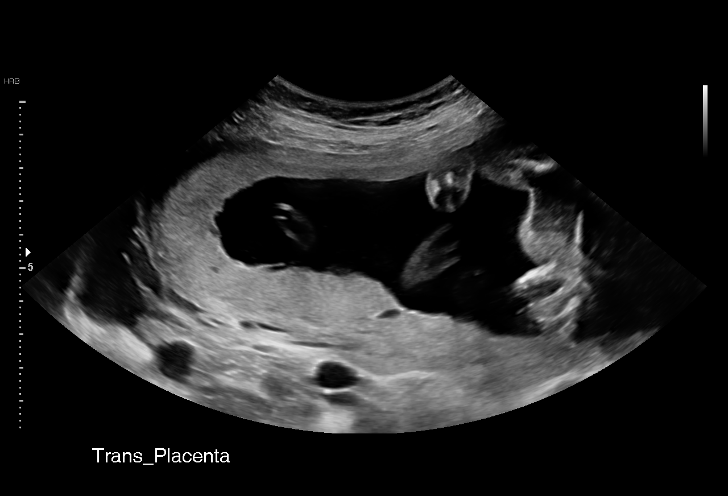
[im 58/71]
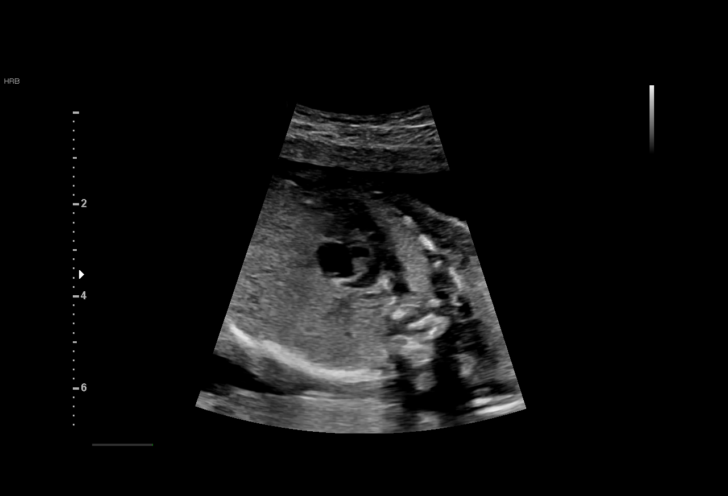
[im 63/71]
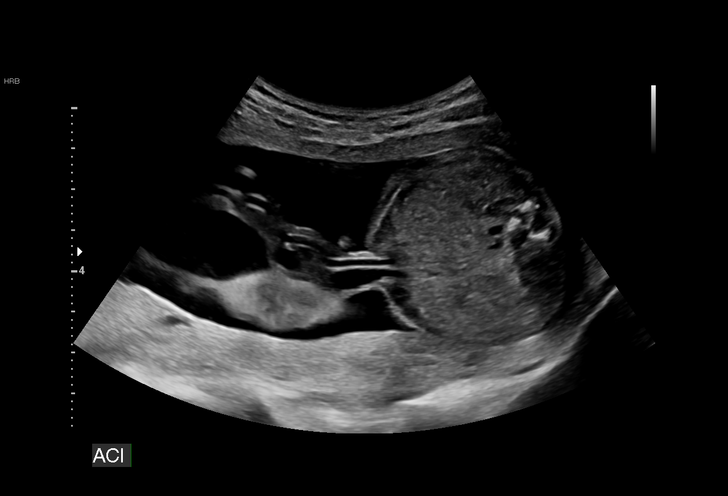
[im 68/71]
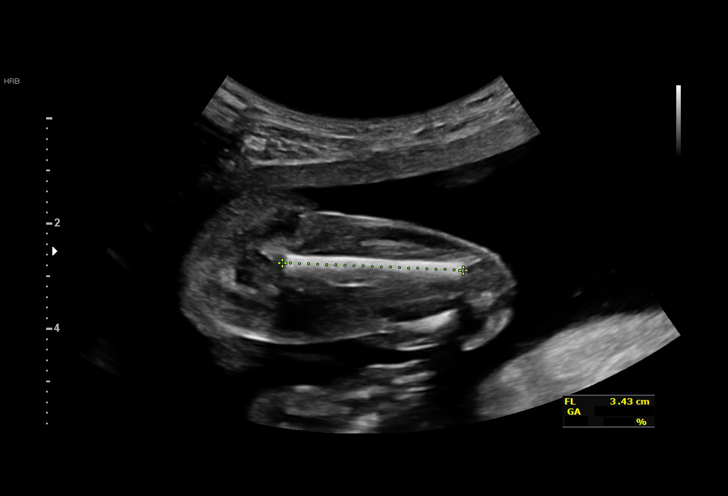

[13 of 28 positions shown; findings below may reference images not displayed]

----------------------------------------------------------------------

 ----------------------------------------------------------------------
Indications

  Pyelectasis of fetus on prenatal ultrasound
  Advanced maternal age multigravida 35+,
  second trimester
  Pregnancy resulting from assisted
  reproductive technology (IVF)
  20 weeks gestation of pregnancy
 ----------------------------------------------------------------------
Vital Signs

 BMI:
Fetal Evaluation

 Num Of Fetuses:         1
 Cardiac Activity:       Observed
 Presentation:           Variable
 Placenta:               Right lateral
 P. Cord Insertion:      Visualized, central

 Amniotic Fluid
 AFI FV:      Within normal limits

                             Largest Pocket(cm)

Biometry

 BPD:      49.8  mm     G. Age:  21w 1d         88  %    CI:        76.05   %    70 - 86
                                                         FL/HC:      18.6   %    16.8 -
 HC:       181   mm     G. Age:  20w 4d         66  %    HC/AC:      1.06        1.09 -
 AC:      170.6  mm     G. Age:  22w 0d         95  %    FL/BPD:     67.5   %
 FL:       33.6  mm     G. Age:  20w 4d         62  %    FL/AC:      19.7   %    20 - 24
 CER:      20.6  mm     G. Age:  19w 4d         41  %

 LV:        5.2  mm
 CM:        2.3  mm
 Est. FW:     411  gm    0 lb 14 oz      97  %
OB History

 Gravidity:    6         Term:   5
 Living:       5
Gestational Age

 U/S Today:     21w 1d                                        EDD:   05/11/19
 Best:          20w 0d     Det. By:  Embryo Transfer          EDD:   05/19/19
Anatomy

 Cranium:               Appears normal         Aortic Arch:            Appears normal
 Cavum:                 Appears normal         Ductal Arch:            Appears normal
 Ventricles:            Appears normal         Diaphragm:              Appears normal
 Choroid Plexus:        Appears normal         Stomach:                Appears normal, left
                                                                       sided
 Cerebellum:            Appears normal         Abdomen:                Appears normal
 Posterior Fossa:       Appears normal         Abdominal Wall:         Appears nml (cord
                                                                       insert, abd wall)
 Nuchal Fold:           Appears normal         Cord Vessels:           Appears normal (3
                                                                       vessel cord)
 Face:                  Appears normal         Kidneys:                Left sided
                        (orbits and profile)
                                                                       pyelectasis, 4.8mm
 Lips:                  Appears normal         Bladder:                Appears normal
 Thoracic:              Appears normal         Spine:                  Appears normal
 Heart:                 Appears normal         Upper Extremities:      Appears normal
                        (4CH, axis, and
                        situs)
 RVOT:                  Appears normal         Lower Extremities:      Appears normal
 LVOT:                  Appears normal

 Other:  Parents do not wish to know sex of fetus. Heels and 5th digit
         visualized.
Cervix Uterus Adnexa

 Cervix
 Length:            3.1  cm.
 Not visualized (advanced GA >24wks)

 Uterus
 No abnormality visualized.

 Left Ovary
 No adnexal mass visualized.

 Right Ovary
 No adnexal mass visualized.

 Cul De Sac
 No free fluid seen.

 Adnexa
 No abnormality visualized.
Impression

 Normal interval growth.  No ultrasonic evidence of structural
 fetal anomalies.
 AMA 48 yo (20 yo donor egg)
 IVF pregnancy- I discussed with Ms. Pollastri the small
 increased risk for minor cardiac defects and therefore would
 recommend a fetal echocardiogram in 2-4 weeks.
 Unilateral pyelectasis - We discussed that this is an isolated
 finding however, fetal pyelectasis can be associated with
 uteropelvic or uteriovesicle junction obstruction. There is no
 evidence of renal caliectasis suggesting [REDACTED] grade 1
 finding. At 32 weeks the threshold for normal moves from 4
 mm to 7 mm.

 After consultation we discussed the option of anueploidy
 screening cell free DNA. She previously declined testing and
 but opted for non-invasive testing as she notes that she
 would not want to take additional risk associated with an
 amniocenetesis. All questions answered.
 I spent 20 minute with > 50% in face to face consultation.
Recommendations

 Consider fetal echocardiogram (referral requested today)
 Follow up fetal kidneys at 32 weeks
 Cell free DNA drawn today.

## 2020-02-15 ENCOUNTER — Other Ambulatory Visit: Payer: Self-pay | Admitting: Family Medicine

## 2020-02-15 DIAGNOSIS — Z1231 Encounter for screening mammogram for malignant neoplasm of breast: Secondary | ICD-10-CM

## 2022-02-19 ENCOUNTER — Other Ambulatory Visit: Payer: Self-pay | Admitting: Family Medicine

## 2022-02-19 DIAGNOSIS — Z1231 Encounter for screening mammogram for malignant neoplasm of breast: Secondary | ICD-10-CM

## 2022-03-22 ENCOUNTER — Other Ambulatory Visit: Payer: Self-pay | Admitting: Obstetrics & Gynecology

## 2022-03-22 DIAGNOSIS — N946 Dysmenorrhea, unspecified: Secondary | ICD-10-CM

## 2022-03-22 DIAGNOSIS — N944 Primary dysmenorrhea: Secondary | ICD-10-CM

## 2023-01-15 DIAGNOSIS — O09812 Supervision of pregnancy resulting from assisted reproductive technology, second trimester: Secondary | ICD-10-CM | POA: Insufficient documentation

## 2023-02-07 ENCOUNTER — Telehealth: Payer: Self-pay

## 2023-02-07 NOTE — Telephone Encounter (Signed)
Left voicemail for patient to call back and confirm is she is continuing care with [facility] or coming to our office.

## 2023-02-14 ENCOUNTER — Encounter: Payer: Self-pay | Admitting: Obstetrics & Gynecology

## 2023-02-14 ENCOUNTER — Ambulatory Visit: Payer: Medicare Other | Admitting: Obstetrics & Gynecology

## 2023-02-14 VITALS — BP 117/70 | HR 80 | Wt 126.0 lb

## 2023-02-14 DIAGNOSIS — O09812 Supervision of pregnancy resulting from assisted reproductive technology, second trimester: Secondary | ICD-10-CM | POA: Diagnosis not present

## 2023-02-14 DIAGNOSIS — Z3480 Encounter for supervision of other normal pregnancy, unspecified trimester: Secondary | ICD-10-CM | POA: Insufficient documentation

## 2023-02-14 DIAGNOSIS — Z3A28 28 weeks gestation of pregnancy: Secondary | ICD-10-CM | POA: Diagnosis not present

## 2023-02-14 DIAGNOSIS — O09523 Supervision of elderly multigravida, third trimester: Secondary | ICD-10-CM | POA: Insufficient documentation

## 2023-02-14 NOTE — Progress Notes (Signed)
  Subjective:she had one prenatal visit with [facility]. Donor egg    Natalie Schmidt is a [id number] [redacted]w[redacted]d being seen today for her first obstetrical visit.  Her obstetrical history is significant for advanced maternal age and IVF . Patient does intend to breast feed. Pregnancy history fully reviewed.  Patient reports no complaints.  Vitals:   02/14/23 1333  BP: 117/70  Pulse: 80  Weight: 126 lb (57.2 kg)    HISTORY: OB History  Gravida Para Term Preterm AB Living  7 6 6     6   SAB IAB Ectopic Multiple Live Births          6    # Outcome Date GA Lbr Len/2nd Weight Sex Type Anes PTL Lv  7 Current           6 Term 05/23/19 [redacted]w[redacted]d   M Vag-Spont EPI  LIV     Complications: Breech birth  5 Term 08/02/04    M Vag-Spont None  LIV  4 Term 08/13/02    M Vag-Spont None  LIV  3 Term 10/14/00    M Vag-Spont None  LIV  2 Term 11/24/98    F Vag-Spont None  LIV  1 Term 08/17/96    F Vag-Spont None  LIV      Exam    Uterus:   28   Pelvic Exam:    Perineum:    Vulva:    Vagina:     pH:    Cervix:    Adnexa:    Bony Pelvis:   System: Breast:     Skin:     Neurologic: oriented, normal mood   Extremities: normal strength, tone, and muscle mass   HEENT PERRLA and neck supple with midline trachea   Mouth/Teeth mucous membranes moist, pharynx normal without lesions and dental hygiene good   Neck supple   Cardiovascular: regular rate and rhythm   Respiratory:  appears well, vitals normal, no respiratory distress, acyanotic, normal RR   Abdomen:    Urinary:       Assessment:    Pregnancy: [id number] Patient Active Problem List   Diagnosis Date Noted   Supervision of other normal pregnancy, antepartum 02/14/2023   Advanced maternal age in multigravida, third trimester 02/14/2023   Pregnancy resulting from assisted reproductive technology in second trimester 01/15/2023   Epicondylitis, lateral (tennis elbow) 12/20/2016   Muscle pain 02/02/2016   Bunion of great toe of left foot  02/02/2016   Eating disorder 04/05/2015   Hypothyroidism 04/05/2015   Anorexia nervosa, restricting type 03/21/2015   ANKLE PAIN, RIGHT 02/09/2008   FOOT PAIN, LEFT 02/09/2008        Plan:     Initial labs drawn. Prenatal vitamins. Problem list reviewed and updated. Genetic Screening discussed : results reviewed.  Ultrasound discussed; fetal survey: results reviewed.  Follow up in 2 weeks. 50% of 30 min visit spent on counseling and coordination of care.     Scheryl Darter 02/14/2023

## 2023-02-20 ENCOUNTER — Telehealth: Payer: Self-pay

## 2023-02-20 NOTE — Telephone Encounter (Signed)
Attempted to contact pt regarding missed call from her. No answer, unable to leave voicemail.

## 2023-02-20 NOTE — Telephone Encounter (Signed)
Patient called the office with questions regarding visitor policy. Patient is requesting her 54.51 year old son be present for delivery. (Patient's EDD is 05/09/2023)  Patient is advised she can not have anyone under the age on 51 years old present for delivery. Patient acknowledges this information but did not confirm compliance.

## 2023-03-06 ENCOUNTER — Ambulatory Visit: Payer: Medicare Other

## 2023-05-11 ENCOUNTER — Inpatient Hospital Stay (HOSPITAL_COMMUNITY): Admit: 2023-05-11 | Payer: Medicare Other | Admitting: Family Medicine
